# Patient Record
Sex: Female | Born: 1965 | Race: White | Hispanic: No | Smoking: Never smoker
Health system: Southern US, Community
[De-identification: ages and names within clinical notes are randomized; demographics above are authoritative.]

## PROBLEM LIST (undated history)

## (undated) DIAGNOSIS — K635 Polyp of colon: Secondary | ICD-10-CM

## (undated) DIAGNOSIS — N920 Excessive and frequent menstruation with regular cycle: Secondary | ICD-10-CM

## (undated) HISTORY — DX: Excessive and frequent menstruation with regular cycle: N92.0

## (undated) HISTORY — DX: Polyp of colon: K63.5

## (undated) HISTORY — PX: TONSILLECTOMY: SUR1361

---

## 2001-10-27 HISTORY — PX: BLADDER SURGERY: SHX569

## 2003-10-28 HISTORY — PX: ABLATION: SHX5711

## 2018-11-15 NOTE — Patient Instructions (Signed)
I value your feedback and entrusting us with your care. If you get a [facility] patient survey, I would appreciate you taking the time to let us know about your experience today. Thank you! 

## 2018-11-15 NOTE — Progress Notes (Signed)
PCP: Patient, No Pcp Per   Chief Complaint  Patient presents with  . Gynecologic Exam    HPI:      [name] is a 53 y.o. No obstetric history on file. who LMP was No LMP recorded. (Menstrual status: Other)., presents today for her NP annual examination.  Her menses are occas light discoloration for a few days. She still has monthly breast tenderness. Mom and sister were late with menopause. Pt is s/p endometrial ablation due to menorrhagia.  Dysmenorrhea none. She does not have intermenstrual bleeding.  She does not have vasomotor sx.   Sex activity: single partner, contraception - tubal ligation. She does not have vaginal dryness.  Last Pap: not recent; hx of abn with colpo in distant past without tx.  Hx of STDs: none  Last mammogram: not recent There is no FH of breast cancer. There is no FH of ovarian cancer. The patient does not do self-breast exams.  Colonoscopy: never. Pt interested in ref.  FH colon cancer and crohn's.  Tobacco use: The patient denies current or previous tobacco use. Alcohol use: none Exercise: moderately active  She does not get adequate calcium but does get Vitamin D in her diet.  No recent labs.   Past Medical History:  Diagnosis Date  . Menorrhagia     Past Surgical History:  Procedure Laterality Date  . ABLATION  2005  . BLADDER SURGERY  2003  . TONSILLECTOMY     pt was 53 yrs old    Family History  Problem Relation Age of Onset  . Cancer Father        leomyosarcoma  . Colon cancer Maternal Aunt 50  . Crohn's disease Daughter   . Breast cancer Neg Hx   . Ovarian cancer Neg Hx     Social History   Socioeconomic History  . Marital status: Legally Separated    Spouse name: Not on file  . Number of children: Not on file  . Years of education: Not on file  . Highest education level: Not on file  Occupational History  . Not on file  Social Needs  . Financial resource strain: Not on file  . Food insecurity:   Worry: Not on file    Inability: Not on file  . Transportation needs:    Medical: Not on file    Non-medical: Not on file  Tobacco Use  . Smoking status: Never Smoker  . Smokeless tobacco: Never Used  Substance and Sexual Activity  . Alcohol use: Never    Frequency: Never  . Drug use: Never  . Sexual activity: Yes  Lifestyle  . Physical activity:    Days per week: Not on file    Minutes per session: Not on file  . Stress: Not on file  Relationships  . Social connections:    Talks on phone: Not on file    Gets together: Not on file    Attends religious service: Not on file    Active member of club or organization: Not on file    Attends meetings of clubs or organizations: Not on file    Relationship status: Not on file  . Intimate partner violence:    Fear of current or ex partner: Not on file    Emotionally abused: Not on file    Physically abused: Not on file    Forced sexual activity: Not on file  Other Topics Concern  . Not on file  Social History Narrative  .  Not on file    No outpatient medications prior to visit.   No facility-administered medications prior to visit.      ROS:  Review of Systems  Constitutional: Negative for fatigue, fever and unexpected weight change.  Respiratory: Negative for cough, shortness of breath and wheezing.   Cardiovascular: Negative for chest pain, palpitations and leg swelling.  Gastrointestinal: Negative for blood in stool, constipation, diarrhea, nausea and vomiting.  Endocrine: Negative for cold intolerance, heat intolerance and polyuria.  Genitourinary: Negative for dyspareunia, dysuria, flank pain, frequency, genital sores, hematuria, menstrual problem, pelvic pain, urgency, vaginal bleeding, vaginal discharge and vaginal pain.  Musculoskeletal: Negative for back pain, joint swelling and myalgias.  Skin: Negative for rash.  Allergic/Immunologic: Positive for environmental allergies.  Neurological: Negative for dizziness,  syncope, light-headedness, numbness and headaches.  Hematological: Negative for adenopathy.  Psychiatric/Behavioral: Negative for agitation, confusion, sleep disturbance and suicidal ideas. The patient is not nervous/anxious.    BREAST: No symptoms    Objective: BP 116/70   Pulse 68   Ht 5\' 8"  (1.727 m)   Wt 195 lb (88.5 kg)   BMI 29.65 kg/m    Physical Exam Constitutional:      Appearance: She is well-developed.  Genitourinary:     Vulva, vagina, cervix, uterus, right adnexa, left adnexa and rectum normal.     No vulval lesion or tenderness noted.     No vaginal discharge, erythema or tenderness.     No cervical polyp.     Uterus is not enlarged or tender.     No right or left adnexal mass present.     Right adnexa not tender.     Left adnexa not tender.  Rectum:     Guaiac result negative.     No anal fissure.  Neck:     Musculoskeletal: Normal range of motion.     Thyroid: No thyromegaly.  Cardiovascular:     Rate and Rhythm: Normal rate and regular rhythm.     Heart sounds: Normal heart sounds. No murmur.  Pulmonary:     Effort: Pulmonary effort is normal.     Breath sounds: Normal breath sounds.  Chest:     Breasts:        Right: No mass, nipple discharge, skin change or tenderness.        Left: No mass, nipple discharge, skin change or tenderness.  Abdominal:     Palpations: Abdomen is soft.     Tenderness: There is no abdominal tenderness. There is no guarding.  Musculoskeletal: Normal range of motion.  Neurological:     Mental Status: She is alert and oriented to person, place, and time.     Cranial Nerves: No cranial nerve deficit.  Psychiatric:        Behavior: Behavior normal.  Vitals signs reviewed.     Results: Results for orders placed or performed in visit on 11/16/18 (from the past 24 hour(s))  POCT Occult Blood Stool     Status: Normal   Collection Time: 11/16/18  8:53 AM  Result Value Ref Range   Fecal Occult Blood, POC Negative  Negative   Card #1 Date     Card #2 Fecal Occult Blod, POC     Card #2 Date     Card #3 Fecal Occult Blood, POC     Card #3 Date      Assessment/Plan:  Encounter for annual routine gynecological examination  Cervical cancer screening - Plan: Cytology - PAP  Screening for  HPV (human papillomavirus) - Plan: Cytology - PAP  Screening for breast cancer - Pt to sched mammo - Plan: MM 3D SCREEN BREAST BILATERAL  Screening for colon cancer - Neg FOBT. Refer to GI for scr colonoscopy due to age/FH. - Plan: POCT Occult Blood Stool, Ambulatory referral to Gastroenterology  Blood tests for routine general physical examination - Check fasting labs. Will call with results.  - Plan: Comprehensive metabolic panel, Lipid panel, Hemoglobin A1c  Screening cholesterol level - Plan: Lipid panel  Screening for diabetes mellitus - Plan: Hemoglobin A1c  BMI 29.0-29.9,adult - Plan: Hemoglobin A1c          GYN counsel breast self exam, mammography screening, menopause, adequate intake of calcium and vitamin D, diet and exercise    F/U  Return in about 1 year (around 11/17/2019).   B. , PA-C 11/16/2018 8:57 AM

## 2018-11-16 ENCOUNTER — Ambulatory Visit (INDEPENDENT_AMBULATORY_CARE_PROVIDER_SITE_OTHER): Payer: 59 | Admitting: Obstetrics and Gynecology

## 2018-11-16 ENCOUNTER — Encounter: Payer: Self-pay | Admitting: Obstetrics and Gynecology

## 2018-11-16 ENCOUNTER — Other Ambulatory Visit (HOSPITAL_COMMUNITY)
Admission: RE | Admit: 2018-11-16 | Discharge: 2018-11-16 | Disposition: A | Discharge status: Home / Self Care | Payer: 59 | Source: Ambulatory Visit | Attending: Obstetrics and Gynecology | Admitting: Obstetrics and Gynecology

## 2018-11-16 VITALS — BP 116/70 | HR 68 | Ht 68.0 in | Wt 195.0 lb

## 2018-11-16 DIAGNOSIS — Z1151 Encounter for screening for human papillomavirus (HPV): Secondary | ICD-10-CM | POA: Insufficient documentation

## 2018-11-16 DIAGNOSIS — Z13220 Encounter for screening for lipoid disorders: Secondary | ICD-10-CM

## 2018-11-16 DIAGNOSIS — Z01419 Encounter for gynecological examination (general) (routine) without abnormal findings: Secondary | ICD-10-CM | POA: Diagnosis not present

## 2018-11-16 DIAGNOSIS — Z124 Encounter for screening for malignant neoplasm of cervix: Secondary | ICD-10-CM

## 2018-11-16 DIAGNOSIS — Z0000 Encounter for general adult medical examination without abnormal findings: Secondary | ICD-10-CM

## 2018-11-16 DIAGNOSIS — Z1211 Encounter for screening for malignant neoplasm of colon: Secondary | ICD-10-CM | POA: Diagnosis not present

## 2018-11-16 DIAGNOSIS — Z131 Encounter for screening for diabetes mellitus: Secondary | ICD-10-CM

## 2018-11-16 DIAGNOSIS — Z6829 Body mass index (BMI) 29.0-29.9, adult: Secondary | ICD-10-CM

## 2018-11-16 DIAGNOSIS — Z1239 Encounter for other screening for malignant neoplasm of breast: Secondary | ICD-10-CM

## 2018-11-16 LAB — HEMOCCULT GUIAC POC 1CARD (OFFICE): Fecal Occult Blood, POC: NEGATIVE

## 2018-11-19 LAB — CYTOLOGY - PAP
Diagnosis: NEGATIVE
HPV: NOT DETECTED

## 2018-11-22 ENCOUNTER — Encounter: Payer: Self-pay | Admitting: *Deleted

## 2019-01-04 ENCOUNTER — Ambulatory Visit
Admission: RE | Admit: 2019-01-04 | Discharge: 2019-01-04 | Disposition: A | Discharge status: Home / Self Care | Payer: 59 | Source: Ambulatory Visit | Attending: Obstetrics and Gynecology | Admitting: Obstetrics and Gynecology

## 2019-01-04 DIAGNOSIS — Z1231 Encounter for screening mammogram for malignant neoplasm of breast: Secondary | ICD-10-CM | POA: Diagnosis not present

## 2019-01-04 DIAGNOSIS — Z1239 Encounter for other screening for malignant neoplasm of breast: Secondary | ICD-10-CM

## 2019-06-22 ENCOUNTER — Other Ambulatory Visit: Payer: Self-pay

## 2019-06-22 ENCOUNTER — Other Ambulatory Visit: Payer: 59

## 2019-06-22 ENCOUNTER — Encounter: Payer: Self-pay | Admitting: Obstetrics and Gynecology

## 2019-06-22 ENCOUNTER — Ambulatory Visit (INDEPENDENT_AMBULATORY_CARE_PROVIDER_SITE_OTHER): Payer: 59 | Admitting: Obstetrics and Gynecology

## 2019-06-22 VITALS — BP 120/80 | Ht 67.0 in | Wt 193.6 lb

## 2019-06-22 DIAGNOSIS — Z0000 Encounter for general adult medical examination without abnormal findings: Secondary | ICD-10-CM

## 2019-06-22 DIAGNOSIS — Z6829 Body mass index (BMI) 29.0-29.9, adult: Secondary | ICD-10-CM

## 2019-06-22 DIAGNOSIS — F4321 Adjustment disorder with depressed mood: Secondary | ICD-10-CM

## 2019-06-22 DIAGNOSIS — N951 Menopausal and female climacteric states: Secondary | ICD-10-CM

## 2019-06-22 DIAGNOSIS — R5383 Other fatigue: Secondary | ICD-10-CM

## 2019-06-22 DIAGNOSIS — Z13220 Encounter for screening for lipoid disorders: Secondary | ICD-10-CM

## 2019-06-22 DIAGNOSIS — Z131 Encounter for screening for diabetes mellitus: Secondary | ICD-10-CM

## 2019-06-22 NOTE — Progress Notes (Signed)
Patient, No Pcp Per   Chief Complaint  Patient presents with  . Hot Flashes    fatigue x on/off 6 months    HPI:      Jacqueline Torres is a 53 y.o. [id number] who LMP was No LMP recorded. Patient has had an ablation., presents today for increased menopausal sx of hot flashes, night sweats, difficulty staying asleep and fatigue for the past 2-3 wks. Pt was having occas brown d/c for several days for "menses" due to endometrial ablation. Still has monthly breast tenderness. No brown d/c for many months now. Used to have occas vasomotor sx.  Pt under increased stress due to loss of father 5/20 and estate settling. Can fall asleep but often wakes up at 1:00/2:00 and can't get back to sleep now. Taking melatonin 3 mg with sx improvement but feels unrested in morning. Pt exercising. Pt feels like her grieving is appropriate and denies anxiety/depresion sx.   Last annual 1/20, due for fasting labs anyway, so will add on labs today.   Past Medical History:  Diagnosis Date  . Menorrhagia     Past Surgical History:  Procedure Laterality Date  . ABLATION  2005  . BLADDER SURGERY  2003  . TONSILLECTOMY     pt was 53 yrs old    Family History  Problem Relation Age of Onset  . Cancer Father        leomyosarcoma  . Colon cancer Maternal Aunt 86  . Crohn's disease Daughter   . Breast cancer Neg Hx   . Ovarian cancer Neg Hx     Social History   Socioeconomic History  . Marital status: Divorced    Spouse name: Not on file  . Number of children: Not on file  . Years of education: Not on file  . Highest education level: Not on file  Occupational History  . Not on file  Social Needs  . Financial resource strain: Not on file  . Food insecurity    Worry: Not on file    Inability: Not on file  . Transportation needs    Medical: Not on file    Non-medical: Not on file  Tobacco Use  . Smoking status: Never Smoker  . Smokeless tobacco: Never Used  Substance and Sexual Activity   . Alcohol use: Never    Frequency: Never  . Drug use: Never  . Sexual activity: Yes    Birth control/protection: Surgical    Comment: Ablation  Lifestyle  . Physical activity    Days per week: Not on file    Minutes per session: Not on file  . Stress: Not on file  Relationships  . Social Herbalist on phone: Not on file    Gets together: Not on file    Attends religious service: Not on file    Active member of club or organization: Not on file    Attends meetings of clubs or organizations: Not on file    Relationship status: Not on file  . Intimate partner violence    Fear of current or ex partner: Not on file    Emotionally abused: Not on file    Physically abused: Not on file    Forced sexual activity: Not on file  Other Topics Concern  . Not on file  Social History Narrative  . Not on file    Outpatient Medications Prior to Visit  Medication Sig Dispense Refill  . tobramycin-dexamethasone (TOBRADEX) ophthalmic solution  INSTILL 1 DROP INTO BOTH EYES 4 TIMES A DAY     No facility-administered medications prior to visit.       ROS:  Review of Systems  Constitutional: Positive for fatigue. Negative for fever and unexpected weight change.  Respiratory: Negative for cough, shortness of breath and wheezing.   Cardiovascular: Negative for chest pain, palpitations and leg swelling.  Gastrointestinal: Positive for constipation. Negative for blood in stool, diarrhea, nausea and vomiting.  Endocrine: Negative for cold intolerance, heat intolerance and polyuria.  Genitourinary: Positive for dyspareunia. Negative for dysuria, flank pain, frequency, genital sores, hematuria, menstrual problem, pelvic pain, urgency, vaginal bleeding, vaginal discharge and vaginal pain.  Musculoskeletal: Positive for arthralgias. Negative for back pain, joint swelling and myalgias.  Skin: Negative for rash.  Neurological: Negative for dizziness, syncope, light-headedness, numbness and  headaches.  Hematological: Negative for adenopathy.  Psychiatric/Behavioral: Negative for agitation, confusion, sleep disturbance and suicidal ideas. The patient is not nervous/anxious.    BREAST: tenderness   OBJECTIVE:   Vitals:  BP 120/80   Ht 5\' 7"  (1.702 m)   Wt 193 lb 9.6 oz (87.8 kg)   BMI 30.32 kg/m   Physical Exam Vitals signs reviewed.  Constitutional:      Appearance: She is well-developed.  Neck:     Musculoskeletal: Normal range of motion.  Pulmonary:     Effort: Pulmonary effort is normal.  Musculoskeletal: Normal range of motion.  Skin:    General: Skin is warm and dry.  Neurological:     General: No focal deficit present.     Mental Status: She is alert and oriented to person, place, and time.     Cranial Nerves: No cranial nerve deficit.  Psychiatric:        Mood and Affect: Mood normal.        Behavior: Behavior normal.        Thought Content: Thought content normal.        Judgment: Judgment normal.     Assessment/Plan: Vasomotor symptoms due to menopause - Plan: Estradiol, Follicle stimulating hormone, TSH + free T4; Check labs. If neg, most likely due to increased stress. Increase exercise/rest/stress mgmt. F/u prn.   Fatigue, unspecified type - Plan: TSH + free T4; see if sx improve with increased sleep time due to melatonin.  Grief reaction--appropriate per pt. F/u prn.      Return if symptoms worsen or fail to improve.   B. , PA-C 06/22/2019 11:40 AM

## 2019-06-22 NOTE — Patient Instructions (Signed)
I value your feedback and entrusting us with your care. If you get a [facility] patient survey, I would appreciate you taking the time to let us know about your experience today. Thank you! 

## 2019-06-23 LAB — COMPREHENSIVE METABOLIC PANEL
ALT: 24 IU/L (ref 0–32)
AST: 20 IU/L (ref 0–40)
Albumin/Globulin Ratio: 2.1 (ref 1.2–2.2)
Albumin: 4.7 g/dL (ref 3.8–4.9)
Alkaline Phosphatase: 62 IU/L (ref 39–117)
BUN/Creatinine Ratio: 15 (ref 9–23)
BUN: 14 mg/dL (ref 6–24)
Bilirubin Total: 0.4 mg/dL (ref 0.0–1.2)
CO2: 26 mmol/L (ref 20–29)
Calcium: 9.6 mg/dL (ref 8.7–10.2)
Chloride: 101 mmol/L (ref 96–106)
Creatinine, Ser: 0.93 mg/dL (ref 0.57–1.00)
GFR calc Af Amer: 81 mL/min/{1.73_m2} (ref >59)
GFR calc non Af Amer: 70 mL/min/{1.73_m2} (ref >59)
Globulin, Total: 2.2 g/dL (ref 1.5–4.5)
Glucose: 88 mg/dL (ref 65–99)
Potassium: 4.2 mmol/L (ref 3.5–5.2)
Sodium: 142 mmol/L (ref 134–144)
Total Protein: 6.9 g/dL (ref 6.0–8.5)

## 2019-06-23 LAB — LIPID PANEL
Chol/HDL Ratio: 2.6 ratio (ref 0.0–4.4)
Cholesterol, Total: 191 mg/dL (ref 100–199)
HDL: 74 mg/dL (ref >39)
LDL Calculated: 96 mg/dL (ref 0–99)
Triglycerides: 106 mg/dL (ref 0–149)
VLDL Cholesterol Cal: 21 mg/dL (ref 5–40)

## 2019-06-23 LAB — TSH+FREE T4
Free T4: 1.01 ng/dL (ref 0.82–1.77)
TSH: 2.16 u[IU]/mL (ref 0.450–4.500)

## 2019-06-23 LAB — HEMOGLOBIN A1C
Est. average glucose Bld gHb Est-mCnc: 111 mg/dL
Hgb A1c MFr Bld: 5.5 % (ref 4.8–5.6)

## 2019-06-23 LAB — ESTRADIOL: Estradiol: 30 pg/mL

## 2019-06-23 LAB — FOLLICLE STIMULATING HORMONE: FSH: 53.3 m[IU]/mL

## 2019-09-15 ENCOUNTER — Telehealth: Payer: Self-pay | Admitting: Obstetrics and Gynecology

## 2019-09-15 NOTE — Telephone Encounter (Signed)
Pt needs new referral for colonoscopy please

## 2019-09-15 NOTE — Telephone Encounter (Signed)
Called pt to let her know msg will be forwarded to [facility]. Advised she is out of the office for vacation but will return her phone call as soon as I hear back from [facility]. No answer, left detailed msg.

## 2019-11-06 ENCOUNTER — Telehealth: Payer: PRIVATE HEALTH INSURANCE | Admitting: Family

## 2019-11-06 DIAGNOSIS — Z20822 Contact with and (suspected) exposure to covid-19: Secondary | ICD-10-CM

## 2019-11-06 MED ORDER — BENZONATATE 100 MG PO CAPS: 100.0000 mg | ORAL_CAPSULE | Freq: Three times a day (TID) | ORAL | 0 refills | Status: DC | PRN

## 2019-11-06 NOTE — Progress Notes (Signed)
E-Visit for Corona Virus Screening   Your current symptoms could be consistent with the coronavirus.  Many health care providers can now test patients at their office but not all are.  [facility] has multiple testing sites. For information on our COVID testing locations and hours go to [email or web address]  We are enrolling you in our MyChart Home Monitoring for COVID19 . Daily you will receive a questionnaire within the MyChart website. Our COVID 19 response team will be monitoring your responses daily.  Testing Information: The COVID-19 Community Testing sites will begin testing BY APPOINTMENT ONLY.  You can schedule online at [email or web address]  If you do not have access to a smart phone or computer you may call [phone] for an appointment.  Testing Locations: Appointment schedule is 8 am to 3:30 pm at all sites  [facility] indoors at [address], [place] [address] [facility]  indoors at [address] Vis[address]trance, [place], [place] [address] [place] indoors at [address], [place] [address]  Additional testing sites in the Community:  . For [facility] Testing sites in [place]  https://www.cvs.com/minuteclinic/covid-19-testing  . For Pop-up testing sites in [place]  https://covid19.ncdhhs.gov/about-covid-19/testing/find-my-testing-place/pop-testing-sites  . For Testing sites with regular hours https://onsms.org/[place]/  . For [place] [facility] https://[facility].com/covid-19-community-outreach-testing/  . For [facility] https://www.[place].gov[place]/human-services/health-department/coronavirus-covid-19-info/covid-19-testing  . For [place] testing in [place] https://www.[place].gov/[place]/human-services/health-department/coronavirus-covid-19-info/covid-19-testing  . For [facility] testing in [place]   https://lhi.care/covidtesting  For  more  information about community testing call [phone]   We are enrolling you in our MyChart Home Monitoring for COVID19 . Daily you will receive a questionnaire within the MyChart website. Our COVID 19 response team will be monitoring your responses daily.  Please quarantine yourself while awaiting your test results. If you develop fever/cough/breathlessness, please stay home for 10 days with improving symptoms and until you have had 24 hours of no fever (without taking a fever reducer).  You should wear a mask or cloth face covering over your nose and mouth if you must be around other people or animals, including pets (even at home). Try to stay at least 6 feet away from other people. This will protect the people around you.  Please continue good preventive care measures, including:  frequent hand-washing, avoid touching your face, cover coughs/sneezes, stay out of crowds and keep a 6 foot distance from others.  COVID-19 is a respiratory illness with symptoms that are similar to the flu. Symptoms are typically mild to moderate, but there have been cases of severe illness and death due to the virus.   The following symptoms may appear 2-14 days after exposure: . Fever . Cough . Shortness of breath or difficulty breathing . Chills . Repeated shaking with chills . Muscle pain . Headache . Sore throat . New loss of taste or smell . Fatigue . Congestion or runny nose . Nausea or vomiting . Diarrhea  Go to the nearest hospital ED for assessment if fever/cough/breathlessness are severe or illness seems like a threat to life.  It is vitally important that if you feel that you have an infection such as this virus or any other virus that you stay home and away from places where you may spread it to others.  You should avoid contact with people age 65 and older.   You can use medication such as A prescription cough medication called Tessalon Perles 100 mg. You may take 1-2 capsules every 8 hours as    needed for cough.  You may also take acetaminophen (Tylenol) as needed for fever.  Reduce your risk of any infection by using the same precautions used for avoiding the common cold or flu:  . Wash your hands often with soap and warm water for at least 20 seconds.  If soap and water are not readily available, use an alcohol-based hand sanitizer with at least 60% alcohol.  . If coughing or sneezing, cover your mouth and nose by coughing or sneezing into the elbow areas of your shirt or coat, into a tissue or into your sleeve (not your hands). . Avoid shaking hands with others and consider head nods or verbal greetings only. . Avoid touching your eyes, nose, or mouth with unwashed hands.  . Avoid close contact with people who are sick. . Avoid places or events with large numbers of people in one location, like concerts or sporting events. . Carefully consider travel plans you have or are making. . If you are planning any travel outside or inside the [place], visit the CDC's Travelers' Health webpage for the latest health notices. . If you have some symptoms but not all symptoms, continue to monitor at home and seek medical attention if your symptoms worsen. . If you are having a medical emergency, call 911.  HOME CARE . Only take medications as instructed by your medical team. . Drink plenty of fluids and get plenty of rest. . A steam or ultrasonic humidifier can help if you have congestion.   GET HELP RIGHT AWAY IF YOU HAVE EMERGENCY WARNING SIGNS** FOR COVID-19. If you or someone is showing any of these signs seek emergency medical care immediately. Call 911 or proceed to your closest emergency facility if: . You develop worsening high fever. . Trouble breathing . Bluish lips or face . Persistent pain or pressure in the chest . New confusion . Inability to wake or stay awake . You cough up blood. . Your symptoms become more severe  **This list is not all possible symptoms. Contact your  medical provider for any symptoms that are sever or concerning to you.  MAKE SURE YOU   Understand these instructions.  Will watch your condition.  Will get help right away if you are not doing well or get worse.  Your e-visit answers were reviewed by a board certified advanced clinical practitioner to complete your personal care plan.  Depending on the condition, your plan could have included both over the counter or prescription medications.  If there is a problem please reply once you have received a response from your provider.  Your safety is important to us.  If you have drug allergies check your prescription carefully.    You can use MyChart to ask questions about today's visit, request a non-urgent call back, or ask for a work or school excuse for 24 hours related to this e-Visit. If it has been greater than 24 hours you will need to follow up with your provider, or enter a new e-Visit to address those concerns. You will get an e-mail in the next two days asking about your experience.  I hope that your e-visit has been valuable and will speed your recovery. Thank you for using e-visits.  Approximately 5 minutes was spent documenting and reviewing patient's chart.    

## 2020-01-13 ENCOUNTER — Ambulatory Visit: Payer: 59 | Attending: Internal Medicine

## 2020-01-13 DIAGNOSIS — Z23 Encounter for immunization: Secondary | ICD-10-CM

## 2020-01-13 NOTE — Progress Notes (Signed)
   Covid-19 Vaccination Clinic  Name:  Jacqueline Torres    MRN: [id number] DOB: April 21, 1966  01/13/2020  Jacqueline Torres was observed post Covid-19 immunization for 15 minutes without incident. She was provided with Vaccine Information Sheet and instruction to access the V-Safe system.   Jacqueline Torres was instructed to call 911 with any severe reactions post vaccine: Marland Kitchen Difficulty breathing  . Swelling of face and throat  . A fast heartbeat  . A bad rash all over body  . Dizziness and weakness   Immunizations Administered    Name Date Dose VIS Date Route   Pfizer COVID-19 Vaccine 01/13/2020  8:33 AM 0.3 mL 10/07/2019 Intramuscular   Manufacturer: [employer], [employer]   Lot: [id number]   NDC: [address]-6

## 2020-02-06 ENCOUNTER — Ambulatory Visit (INDEPENDENT_AMBULATORY_CARE_PROVIDER_SITE_OTHER): Payer: 59 | Admitting: Obstetrics and Gynecology

## 2020-02-06 ENCOUNTER — Other Ambulatory Visit: Payer: Self-pay

## 2020-02-06 ENCOUNTER — Encounter: Payer: Self-pay | Admitting: Obstetrics and Gynecology

## 2020-02-06 VITALS — BP 110/90 | Ht 68.0 in | Wt 197.0 lb

## 2020-02-06 DIAGNOSIS — Z1211 Encounter for screening for malignant neoplasm of colon: Secondary | ICD-10-CM | POA: Diagnosis not present

## 2020-02-06 DIAGNOSIS — R310 Gross hematuria: Secondary | ICD-10-CM | POA: Diagnosis not present

## 2020-02-06 LAB — POCT URINALYSIS DIPSTICK
Bilirubin, UA: NEGATIVE
Blood, UA: NEGATIVE
Glucose, UA: NEGATIVE
Ketones, UA: NEGATIVE
Leukocytes, UA: NEGATIVE
Nitrite, UA: NEGATIVE
Protein, UA: NEGATIVE
Spec Grav, UA: 1.02 (ref 1.010–1.025)
pH, UA: 5 (ref 5.0–8.0)

## 2020-02-06 NOTE — Progress Notes (Signed)
Patient, No Pcp Per   Chief Complaint  Patient presents with  . Urinary Tract Infection    blood in urine, no urinary frequency or burning     HPI:      [name] is a 54 y.o. [id number] who LMP was No LMP recorded. Patient has had an ablation., presents today for hematuria that lasts 1 day. Sx have happened twice, both a day or 2 after sex. No bleeding during/after sex, no unusual pain with sex, no vag dryness. First had sx 1/21 and treated with abx for UTI. Sx then recurred last wk. Pt almost sure it's in urine and not vaginal. No blood in underwear; only notes blood with wiping after urination. Hx of bladder sling. No hx of kidney stones. Having increased urge incont, drinks 2 caffeinated drinks daily.   Menses are infrequent; hx of endometrial ablation.   Past Medical History:  Diagnosis Date  . Menorrhagia     Past Surgical History:  Procedure Laterality Date  . ABLATION  2005  . BLADDER SURGERY  2003  . TONSILLECTOMY     pt was 54 yrs old    Family History  Problem Relation Age of Onset  . Cancer Father        leomyosarcoma  . Colon cancer Maternal Aunt 50  . Crohn's disease Daughter   . Breast cancer Neg Hx   . Ovarian cancer Neg Hx     Social History   Socioeconomic History  . Marital status: Divorced    Spouse name: Not on file  . Number of children: Not on file  . Years of education: Not on file  . Highest education level: Not on file  Occupational History  . Not on file  Tobacco Use  . Smoking status: Never Smoker  . Smokeless tobacco: Never Used  Substance and Sexual Activity  . Alcohol use: Never  . Drug use: Never  . Sexual activity: Yes    Birth control/protection: Surgical    Comment: Ablation  Other Topics Concern  . Not on file  Social History Narrative  . Not on file   Social Determinants of Health   Financial Resource Strain:   . Difficulty of Paying Living Expenses:   Food Insecurity:   . Worried About Brewing technologist in the Last Year:   . Barista in the Last Year:   Transportation Needs:   . Freight forwarder (Medical):   Marland Kitchen Lack of Transportation (Non-Medical):   Physical Activity:   . Days of Exercise per Week:   . Minutes of Exercise per Session:   Stress:   . Feeling of Stress :   Social Connections:   . Frequency of Communication with Friends and Family:   . Frequency of Social Gatherings with Friends and Family:   . Attends Religious Services:   . Active Member of Clubs or Organizations:   . Attends Banker Meetings:   Marland Kitchen Marital Status:   Intimate Partner Violence:   . Fear of Current or Ex-Partner:   . Emotionally Abused:   Marland Kitchen Physically Abused:   . Sexually Abused:     Outpatient Medications Prior to Visit  Medication Sig Dispense Refill  . benzonatate (TESSALON PERLES) 100 MG capsule Take 1 capsule (100 mg total) by mouth 3 (three) times daily as needed. 20 capsule 0  . tobramycin-dexamethasone (TOBRADEX) ophthalmic solution INSTILL 1 DROP INTO BOTH EYES 4 TIMES A DAY  No facility-administered medications prior to visit.      ROS:  Review of Systems  Constitutional: Negative for fever.  Gastrointestinal: Negative for blood in stool, constipation, diarrhea, nausea and vomiting.  Genitourinary: Positive for hematuria and urgency. Negative for dyspareunia, dysuria, flank pain, frequency, vaginal bleeding, vaginal discharge and vaginal pain.  Musculoskeletal: Negative for back pain.  Skin: Negative for rash.  BREAST: No symptoms   OBJECTIVE:   Vitals:  BP 110/90   Ht 5\' 8"  (1.727 m)   Wt 197 lb (89.4 kg)   BMI 29.95 kg/m   Physical Exam Vitals reviewed.  Constitutional:      Appearance: She is well-developed.  Pulmonary:     Effort: Pulmonary effort is normal.  Genitourinary:    General: Normal vulva.     Pubic Area: No rash.      Labia:        Right: No rash, tenderness or lesion.        Left: No rash, tenderness or lesion.       Vagina: Normal. No vaginal discharge, erythema or tenderness.     Cervix: Normal.     Uterus: Normal. Not enlarged and not tender.      Adnexa: Right adnexa normal and left adnexa normal.       Right: No mass or tenderness.         Left: No mass or tenderness.    Musculoskeletal:        General: Normal range of motion.     Cervical back: Normal range of motion.  Skin:    General: Skin is warm and dry.  Neurological:     General: No focal deficit present.     Mental Status: She is alert and oriented to person, place, and time.  Psychiatric:        Mood and Affect: Mood normal.        Behavior: Behavior normal.        Thought Content: Thought content normal.        Judgment: Judgment normal.     Results: Results for orders placed or performed in visit on 02/06/20 (from the past 24 hour(s))  POCT Urinalysis Dipstick     Status: Normal   Collection Time: 02/06/20  5:03 PM  Result Value Ref Range   Color, UA yellow    Clarity, UA clear    Glucose, UA Negative Negative   Bilirubin, UA neg    Ketones, UA neg    Spec Grav, UA 1.020 1.010 - 1.025   Blood, UA neg    pH, UA 5.0 5.0 - 8.0   Protein, UA Negative Negative   Urobilinogen, UA     Nitrite, UA neg    Leukocytes, UA Negative Negative   Appearance     Odor       Assessment/Plan: Gross hematuria - Plan: POCT Urinalysis Dipstick, Urine Culture; Neg UA, neg vaginal exam. Check C&S. Will f/u with results if pos. Otherwise, if sx happen again, pt to insert tampon to make sure bleeding is truly from bladder vs vaginally and f/u. If from bladder, will refer to urology.   Screening for colon cancer - Plan: Ambulatory referral to Gastroenterology; Pt never had colonoscopy last yr due to covid and needs new ref.      Return in about 4 weeks (around 03/05/2020), or if symptoms worsen or fail to improve, for annual.  Jacqueline Torres B. Jullian Clayson, PA-C 02/06/2020 5:05 PM

## 2020-02-06 NOTE — Patient Instructions (Signed)
I value your feedback and entrusting us with your care. If you get a [facility] patient survey, I would appreciate you taking the time to let us know about your experience today. Thank you!  As of October 06, 2019, your lab results will be released to your MyChart immediately, before I even have a chance to see them. Please give me time to review them and contact you if there are any abnormalities. Thank you for your patience.  

## 2020-02-08 ENCOUNTER — Telehealth: Payer: Self-pay

## 2020-02-08 ENCOUNTER — Ambulatory Visit: Payer: 59 | Attending: Internal Medicine

## 2020-02-08 DIAGNOSIS — Z23 Encounter for immunization: Secondary | ICD-10-CM

## 2020-02-08 LAB — URINE CULTURE

## 2020-02-08 NOTE — Progress Notes (Signed)
   Covid-19 Vaccination Clinic  Name:  Jacqueline Torres    MRN: [id number] DOB: 1966-05-14  02/08/2020  [name] was observed post Covid-19 immunization for 15 minutes without incident. She was provided with Vaccine Information Sheet and instruction to access the V-Safe system.   [name] was instructed to call 911 with any severe reactions post vaccine: Marland Kitchen Difficulty breathing  . Swelling of face and throat  . A fast heartbeat  . A bad rash all over body  . Dizziness and weakness   Immunizations Administered    Name Date Dose VIS Date Route   Pfizer COVID-19 Vaccine 02/08/2020  4:33 PM 0.3 mL 10/07/2019 Intramuscular   Manufacturer: [employer], [employer]   Lot: [id number]   NDC: [address]-3

## 2020-02-08 NOTE — Telephone Encounter (Signed)
Patient would like to first know if it's possible to have her colon procedure on 5/17? If so, she's ready to proceed. Please let me know and I'll call her an make a nurse visit

## 2020-02-09 NOTE — Telephone Encounter (Signed)
Nurse visit Wed. 02/15/20. Prefers Dr. Tobi Bastos for procedure. Referral status closed. Pls re-open

## 2020-02-15 ENCOUNTER — Telehealth (INDEPENDENT_AMBULATORY_CARE_PROVIDER_SITE_OTHER): Payer: Self-pay | Admitting: Gastroenterology

## 2020-02-15 ENCOUNTER — Other Ambulatory Visit: Payer: Self-pay

## 2020-02-15 DIAGNOSIS — Z1211 Encounter for screening for malignant neoplasm of colon: Secondary | ICD-10-CM

## 2020-02-15 NOTE — Progress Notes (Signed)
Gastroenterology Pre-Procedure Review  Request Date: Monday May 17th Requesting Physician: Dr. Tobi Bastos  PATIENT REVIEW QUESTIONS: The patient responded to the following health history questions as indicated:    1. Are you having any GI issues? no 2. Do you have a personal history of Polyps? no 3. Do you have a family history of Colon Cancer or Polyps? no 4. Diabetes Mellitus? no 5. Joint replacements in the past 12 months?no 6. Major health problems in the past 3 months?no 7. Any artificial heart valves, MVP, or defibrillator?no    MEDICATIONS & ALLERGIES:    Patient reports the following regarding taking any anticoagulation/antiplatelet therapy:   Plavix, Coumadin, Eliquis, Xarelto, Lovenox, Pradaxa, Brilinta, or Effient? no Aspirin? no  Patient confirms/reports the following medications:  No current outpatient medications on file.   No current facility-administered medications for this visit.    Patient confirms/reports the following allergies:  Allergies  Allergen Reactions  . Sulfa Antibiotics     Rash, pt feels like she is hungover.    No orders of the defined types were placed in this encounter.   AUTHORIZATION INFORMATION Primary Insurance: 1D#: Group #:  Secondary Insurance: 1D#: Group #:  SCHEDULE INFORMATION: Date: Monday May 17th Time: Location:[facility]

## 2020-03-08 ENCOUNTER — Other Ambulatory Visit
Admission: RE | Admit: 2020-03-08 | Discharge: 2020-03-08 | Disposition: A | Discharge status: Home / Self Care | Payer: 59 | Source: Ambulatory Visit | Attending: Gastroenterology | Admitting: Gastroenterology

## 2020-03-08 ENCOUNTER — Other Ambulatory Visit: Payer: Self-pay

## 2020-03-08 DIAGNOSIS — Z01812 Encounter for preprocedural laboratory examination: Secondary | ICD-10-CM | POA: Insufficient documentation

## 2020-03-08 DIAGNOSIS — Z20822 Contact with and (suspected) exposure to covid-19: Secondary | ICD-10-CM | POA: Diagnosis not present

## 2020-03-08 LAB — SARS CORONAVIRUS 2 (TAT 6-24 HRS): SARS Coronavirus 2: NEGATIVE

## 2020-03-09 ENCOUNTER — Encounter: Payer: Self-pay | Admitting: Gastroenterology

## 2020-03-12 ENCOUNTER — Ambulatory Visit: Payer: 59 | Admitting: Anesthesiology

## 2020-03-12 ENCOUNTER — Encounter
Admission: RE | Disposition: A | Discharge status: Home / Self Care | Payer: Self-pay | Source: Home / Self Care | Attending: Gastroenterology

## 2020-03-12 ENCOUNTER — Ambulatory Visit
Admission: RE | Admit: 2020-03-12 | Discharge: 2020-03-12 | Disposition: A | Discharge status: Home / Self Care | Payer: 59 | Attending: Gastroenterology | Admitting: Gastroenterology

## 2020-03-12 ENCOUNTER — Other Ambulatory Visit: Payer: Self-pay

## 2020-03-12 DIAGNOSIS — Z808 Family history of malignant neoplasm of other organs or systems: Secondary | ICD-10-CM | POA: Insufficient documentation

## 2020-03-12 DIAGNOSIS — Z800 Family history of malignant neoplasm of digestive organs: Secondary | ICD-10-CM | POA: Diagnosis not present

## 2020-03-12 DIAGNOSIS — Z882 Allergy status to sulfonamides status: Secondary | ICD-10-CM | POA: Insufficient documentation

## 2020-03-12 DIAGNOSIS — Z1211 Encounter for screening for malignant neoplasm of colon: Secondary | ICD-10-CM | POA: Diagnosis not present

## 2020-03-12 DIAGNOSIS — K5730 Diverticulosis of large intestine without perforation or abscess without bleeding: Secondary | ICD-10-CM | POA: Insufficient documentation

## 2020-03-12 DIAGNOSIS — K635 Polyp of colon: Secondary | ICD-10-CM | POA: Diagnosis not present

## 2020-03-12 HISTORY — PX: COLONOSCOPY WITH PROPOFOL: SHX5780

## 2020-03-12 SURGERY — COLONOSCOPY WITH PROPOFOL
Anesthesia: General

## 2020-03-12 MED ORDER — SODIUM CHLORIDE 0.9 % IV SOLN: INTRAVENOUS | Status: DC

## 2020-03-12 MED ORDER — ONDANSETRON HCL 4 MG/2ML IJ SOLN
INTRAMUSCULAR | Status: AC
  Filled 2020-03-12: qty 2

## 2020-03-12 MED ORDER — PROPOFOL 10 MG/ML IV BOLUS
INTRAVENOUS | Status: DC | PRN
  Administered 2020-03-12: 20 mg via INTRAVENOUS
  Administered 2020-03-12: 70 mg via INTRAVENOUS

## 2020-03-12 MED ORDER — MIDAZOLAM HCL 2 MG/2ML IJ SOLN
INTRAMUSCULAR | Status: DC | PRN
  Administered 2020-03-12: 2 mg via INTRAVENOUS

## 2020-03-12 MED ORDER — PROPOFOL 500 MG/50ML IV EMUL
INTRAVENOUS | Status: AC
  Filled 2020-03-12: qty 50

## 2020-03-12 MED ORDER — ONDANSETRON HCL 4 MG/2ML IJ SOLN
INTRAMUSCULAR | Status: DC | PRN
  Administered 2020-03-12: 4 mg via INTRAVENOUS

## 2020-03-12 MED ORDER — LIDOCAINE HCL (CARDIAC) PF 100 MG/5ML IV SOSY
PREFILLED_SYRINGE | INTRAVENOUS | Status: DC | PRN
  Administered 2020-03-12: 40 mg via INTRAVENOUS

## 2020-03-12 MED ORDER — PROPOFOL 500 MG/50ML IV EMUL
INTRAVENOUS | Status: DC | PRN
  Administered 2020-03-12: 150 ug/kg/min via INTRAVENOUS

## 2020-03-12 MED ORDER — MIDAZOLAM HCL 2 MG/2ML IJ SOLN
INTRAMUSCULAR | Status: AC
  Filled 2020-03-12: qty 2

## 2020-03-12 MED ORDER — LIDOCAINE HCL (PF) 2 % IJ SOLN
INTRAMUSCULAR | Status: AC
  Filled 2020-03-12: qty 5

## 2020-03-12 NOTE — Transfer of Care (Signed)
Immediate Anesthesia Transfer of Care Note  Patient: Jacqueline Torres  Procedure(s) Performed: Procedure(s): COLONOSCOPY WITH PROPOFOL (N/A)  Patient Location: PACU and Endoscopy Unit  Anesthesia Type:General  Level of Consciousness: sedated  Airway & Oxygen Therapy: Patient Spontanous Breathing and Patient connected to nasal cannula oxygen  Post-op Assessment: Report given to RN and Post -op Vital signs reviewed and stable  Post vital signs: Reviewed and stable  Last Vitals:  Vitals:   03/12/20 0742 03/12/20 0848  BP: 135/90 (!) 146/94  Pulse: 83 85  Resp: 18 11  Temp: (!) 36.1 C (!) 36.1 C  SpO2: 98% 97%    Complications: No apparent anesthesia complications

## 2020-03-12 NOTE — H&P (Signed)
Jacqueline Bellows, MD [address], [place], [place], [place], [address] [address], [place], [place], [place], [address] Phone: [phone]  Fax: [phone]  Primary Care Physician:  [facility], [place]   Pre-Procedure History & Physical: HPI:  Jacqueline Torres is a 54 y.o. female is here for an colonoscopy.   Past Medical History:  Diagnosis Date  . Menorrhagia     Past Surgical History:  Procedure Laterality Date  . ABLATION  2005  . BLADDER SURGERY  2003  . TONSILLECTOMY     pt was 54 yrs old    Prior to Admission medications   Not on File    Allergies as of 02/15/2020 - Review Complete 02/15/2020  Allergen Reaction Noted  . Sulfa antibiotics  11/16/2018    Family History  Problem Relation Age of Onset  . Cancer Father        leomyosarcoma  . Colon cancer Maternal Aunt 62  . Crohn's disease Daughter   . Breast cancer Neg Hx   . Ovarian cancer Neg Hx     Social History   Socioeconomic History  . Marital status: Divorced    Spouse name: Not on file  . Number of children: Not on file  . Years of education: Not on file  . Highest education level: Not on file  Occupational History  . Not on file  Tobacco Use  . Smoking status: Never Smoker  . Smokeless tobacco: Never Used  Substance and Sexual Activity  . Alcohol use: Never  . Drug use: Never  . Sexual activity: Yes    Birth control/protection: Surgical    Comment: Ablation  Other Topics Concern  . Not on file  Social History Narrative  . Not on file   Social Determinants of Health   Financial Resource Strain:   . Difficulty of Paying Living Expenses:   Food Insecurity:   . Worried About Charity fundraiser in the Last Year:   . Arboriculturist in the Last Year:   Transportation Needs:   . Film/video editor (Medical):   Marland Kitchen Lack of Transportation (Non-Medical):   Physical Activity:   . Days of Exercise per Week:   . Minutes of Exercise per Session:   Stress:   . Feeling  of Stress :   Social Connections:   . Frequency of Communication with Friends and Family:   . Frequency of Social Gatherings with Friends and Family:   . Attends Religious Services:   . Active Member of Clubs or Organizations:   . Attends Archivist Meetings:   Marland Kitchen Marital Status:   Intimate Partner Violence:   . Fear of Current or Ex-Partner:   . Emotionally Abused:   Marland Kitchen Physically Abused:   . Sexually Abused:     Review of Systems: See HPI, otherwise negative ROS  Physical Exam: BP 135/90   Pulse 83   Temp (!) 97 F (36.1 C) (Temporal)   Resp 18   Ht 5\' 8"  (1.727 m)   Wt 85.7 kg   SpO2 98%   BMI 28.74 kg/m  General:   Alert,  pleasant and cooperative in NAD Head:  Normocephalic and atraumatic. Neck:  Supple; no masses or thyromegaly. Lungs:  Clear throughout to auscultation, normal respiratory effort.    Heart:  +S1, +S2, Regular rate and rhythm, No edema. Abdomen:  Soft, nontender and nondistended. Normal bowel sounds, without guarding, and without rebound.   Neurologic:  Alert and  oriented x4;  grossly normal neurologically.  Impression/Plan: Jacqueline Torres is here for an colonoscopy to be performed for Screening colonoscopy average risk   Risks, benefits, limitations, and alternatives regarding  colonoscopy have been reviewed with the patient.  Questions have been answered.  All parties agreeable.   Wyline Mood, MD  03/12/2020, 8:18 AM

## 2020-03-12 NOTE — Op Note (Signed)
[facility] Gastroenterology Patient Name: Jacqueline Torres Procedure Date: 03/12/2020 8:11 AM MRN: [id number] Account #: [id number] Date of Birth: Dec 30, 1965 Admit Type: Outpatient Age: 54 Room: [facility] ENDO ROOM 4 Gender: Female Note Status: Finalized Procedure:             Colonoscopy Indications:           Screening for colorectal malignant neoplasm Providers:             Wyline Mood MD, MD Referring MD:          Althea Grimmer PA Medicines:             Monitored Anesthesia Care Complications:         No immediate complications. Procedure:             Pre-Anesthesia Assessment:                        - Prior to the procedure, a History and Physical was                         performed, and patient medications, allergies and                         sensitivities were reviewed. The patient's tolerance                         of previous anesthesia was reviewed.                        - The risks and benefits of the procedure and the                         sedation options and risks were discussed with the                         patient. All questions were answered and informed                         consent was obtained.                        - ASA Grade Assessment: II - A patient with mild                         systemic disease.                        After obtaining informed consent, the colonoscope was                         passed under direct vision. Throughout the procedure,                         the patient's blood pressure, pulse, and oxygen                         saturations were monitored continuously. The                         Colonoscope was introduced through the anus and  advanced to the the cecum, identified by the                         appendiceal orifice. The colonoscopy was performed                         with ease. The patient tolerated the procedure well.                         The quality of the bowel  preparation was excellent. Findings:      The perianal and digital rectal examinations were normal.      A 7 mm polyp was found in the ascending colon. The polyp was sessile.       The polyp was removed with a cold snare. Resection and retrieval were       complete.      Multiple small-mouthed diverticula were found in the left colon.      The exam was otherwise without abnormality on direct and retroflexion       views. Impression:            - One 7 mm polyp in the ascending colon, removed with                         a cold snare. Resected and retrieved.                        - Diverticulosis in the left colon.                        - The examination was otherwise normal on direct and                         retroflexion views. Recommendation:        - Discharge patient to home (with escort).                        - Resume previous diet.                        - Continue present medications.                        - Await pathology results.                        - Repeat colonoscopy for surveillance based on                         pathology results. Procedure Code(s):     --- Professional ---                        [id number], Colonoscopy, flexible; with removal of                         tumor(s), polyp(s), or other lesion(s) by snare                         technique Diagnosis Code(s):     --- Professional ---  Z12.11, Encounter for screening for malignant neoplasm                         of colon                        K63.5, Polyp of colon                        K57.30, Diverticulosis of large intestine without                         perforation or abscess without bleeding CPT copyright 2019 American Medical Association. All rights reserved. The codes documented in this report are preliminary and upon coder review may  be revised to meet current compliance requirements. Wyline Mood, MD Wyline Mood MD, MD 03/12/2020 8:46:21 AM This report has been signed  electronically. Number of Addenda: 0 Note Initiated On: 03/12/2020 8:11 AM Scope Withdrawal Time: 0 hours 13 minutes 21 seconds  Total Procedure Duration: 0 hours 20 minutes 25 seconds  Estimated Blood Loss:  Estimated blood loss: none.      [facility]

## 2020-03-12 NOTE — Anesthesia Preprocedure Evaluation (Signed)
Anesthesia Evaluation  Patient identified by MRN, date of birth, ID band Patient awake    Reviewed: Allergy & Precautions, NPO status , Patient's Chart, lab work & pertinent test results  History of Anesthesia Complications (+) PONVNegative for: history of anesthetic complications  Airway Mallampati: II  TM Distance: >3 FB Neck ROM: Full    Dental no notable dental hx. (+) Teeth Intact   Pulmonary neg pulmonary ROS, neg sleep apnea, neg COPD, Patient abstained from smoking.Not current smoker,    Pulmonary exam normal breath sounds clear to auscultation       Cardiovascular Exercise Tolerance: Good METS(-) hypertension(-) CAD and (-) Past MI negative cardio ROS  (-) dysrhythmias  Rhythm:Regular Rate:Normal - Systolic murmurs    Neuro/Psych negative neurological ROS  negative psych ROS   GI/Hepatic neg GERD  ,(+)     (-) substance abuse  ,   Endo/Other  neg diabetes  Renal/GU negative Renal ROS     Musculoskeletal   Abdominal   Peds  Hematology   Anesthesia Other Findings Past Medical History: No date: Menorrhagia  Reproductive/Obstetrics                             Anesthesia Physical Anesthesia Plan  ASA: II  Anesthesia Plan: General   Post-op Pain Management:    Induction: Intravenous  PONV Risk Score and Plan: 4 or greater and Ondansetron, Propofol infusion and TIVA  Airway Management Planned: Nasal Cannula  Additional Equipment: None  Intra-op Plan:   Post-operative Plan:   Informed Consent: I have reviewed the patients History and Physical, chart, labs and discussed the procedure including the risks, benefits and alternatives for the proposed anesthesia with the patient or authorized representative who has indicated his/her understanding and acceptance.     Dental advisory given  Plan Discussed with: CRNA and Surgeon  Anesthesia Plan Comments: (Discussed risks  of anesthesia with patient, including possibility of difficulty with spontaneous ventilation under anesthesia necessitating airway intervention, PONV, and rare risks such as cardiac or respiratory or neurological events. Patient understands.)        Anesthesia Quick Evaluation

## 2020-03-12 NOTE — Anesthesia Postprocedure Evaluation (Signed)
Anesthesia Post Note  Patient: Jacqueline Torres  Procedure(s) Performed: COLONOSCOPY WITH PROPOFOL (N/A )  Patient location during evaluation: Endoscopy Anesthesia Type: General Level of consciousness: awake and alert Pain management: pain level controlled Vital Signs Assessment: post-procedure vital signs reviewed and stable Respiratory status: spontaneous breathing, nonlabored ventilation, respiratory function stable and patient connected to nasal cannula oxygen Cardiovascular status: blood pressure returned to baseline and stable Postop Assessment: no apparent nausea or vomiting Anesthetic complications: no     Last Vitals:  Vitals:   03/12/20 0928 03/12/20 0938  BP: 140/80 (!) 142/80  Pulse: 81 72  Resp: 17 17  Temp:    SpO2: 100% 99%    Last Pain:  Vitals:   03/12/20 0938  TempSrc:   PainSc: 3                  Corinda Gubler

## 2020-03-12 NOTE — Anesthesia Procedure Notes (Signed)
Date/Time: 03/12/2020 8:18 AM Performed by: Stormy Fabian, CRNA Pre-anesthesia Checklist: Patient identified, Emergency Drugs available, Suction available and Patient being monitored Patient Re-evaluated:Patient Re-evaluated prior to induction Oxygen Delivery Method: Nasal cannula Induction Type: IV induction Dental Injury: Teeth and Oropharynx as per pre-operative assessment  Comments: Nasal cannula with etCO2 monitoring

## 2020-03-12 NOTE — OR Nursing (Signed)
Spoke with Dr. Suzan Slick, OK to proceed with procedure without urine pregnancy test.

## 2020-03-13 LAB — SURGICAL PATHOLOGY

## 2020-03-14 ENCOUNTER — Encounter: Payer: Self-pay | Admitting: *Deleted

## 2020-03-21 ENCOUNTER — Encounter: Payer: Self-pay | Admitting: Gastroenterology

## 2021-01-15 ENCOUNTER — Encounter: Payer: Self-pay | Admitting: Obstetrics and Gynecology

## 2021-01-15 NOTE — Progress Notes (Signed)
PCP: [facility] [place]   Chief Complaint  Patient presents with  . Gynecologic Exam    No concerns    HPI:      Jacqueline Torres is a 55 y.o. No obstetric history on file. who LMP was No LMP recorded. Patient has had an ablation., presents today for her annual examination.  Her menses are occas light discoloration for a few days. Mom and sister were late with menopause. Pt is s/p endometrial ablation due to menorrhagia.  Dysmenorrhea none.   Sex activity: single partner, contraception - tubal ligation. She does not have vaginal dryness.  Last Pap: 11/16/18 Results were normal, neg HPV DNA. Hx of abn pap with colpo in distant past without tx.  Hx of STDs: none  Last mammogram: 01/04/19 Results were normal, repeat in 12 months.  There is no FH of breast cancer. There is no FH of ovarian cancer. The patient self-breast exams.  Colonoscopy: 5/21 with polyps; repeat due after 5 yrs, Dr. Tobi Bastos. FH colon cancer and crohn's.  Tobacco use: The patient denies current or previous tobacco use. Alcohol use: none  No drug use Exercise: min active   She does get adequate calcium and Vitamin D in her diet.  Normal labs 8/20   Past Medical History:  Diagnosis Date  . Colon polyps   . Menorrhagia     Past Surgical History:  Procedure Laterality Date  . ABLATION  2005  . BLADDER SURGERY  2003  . COLONOSCOPY WITH PROPOFOL N/A 03/12/2020   repeat after 5 yrs; Procedure: COLONOSCOPY WITH PROPOFOL;  Surgeon: Wyline Mood, MD;  Location: [facility] ENDOSCOPY;  Service: Gastroenterology;  Laterality: N/A;  . TONSILLECTOMY     pt was 55 yrs old    Family History  Problem Relation Age of Onset  . Cancer Father        leomyosarcoma  . Colon cancer Maternal Aunt 50  . Crohn's disease Daughter   . Breast cancer Neg Hx   . Ovarian cancer Neg Hx     Social History   Socioeconomic History  . Marital status: Divorced    Spouse name: Not on file  . Number of children: Not on file  .  Years of education: Not on file  . Highest education level: Not on file  Occupational History  . Not on file  Tobacco Use  . Smoking status: Never Smoker  . Smokeless tobacco: Never Used  Vaping Use  . Vaping Use: Never used  Substance and Sexual Activity  . Alcohol use: Never  . Drug use: Never  . Sexual activity: Yes    Birth control/protection: Surgical    Comment: Ablation  Other Topics Concern  . Not on file  Social History Narrative  . Not on file   Social Determinants of Health   Financial Resource Strain: Not on file  Food Insecurity: Not on file  Transportation Needs: Not on file  Physical Activity: Not on file  Stress: Not on file  Social Connections: Not on file  Intimate Partner Violence: Not on file    No outpatient medications prior to visit.   No facility-administered medications prior to visit.     ROS:  Review of Systems  Constitutional: Negative for fatigue, fever and unexpected weight change.  Respiratory: Negative for cough, shortness of breath and wheezing.   Cardiovascular: Negative for chest pain, palpitations and leg swelling.  Gastrointestinal: Negative for blood in stool, constipation, diarrhea, nausea and vomiting.  Endocrine: Negative for  cold intolerance, heat intolerance and polyuria.  Genitourinary: Negative for dyspareunia, dysuria, flank pain, frequency, genital sores, hematuria, menstrual problem, pelvic pain, urgency, vaginal bleeding, vaginal discharge and vaginal pain.  Musculoskeletal: Negative for back pain, joint swelling and myalgias.  Skin: Negative for rash.  Allergic/Immunologic: Positive for environmental allergies.  Neurological: Negative for dizziness, syncope, light-headedness, numbness and headaches.  Hematological: Negative for adenopathy.  Psychiatric/Behavioral: Negative for agitation, confusion, sleep disturbance and suicidal ideas. The patient is not nervous/anxious.    BREAST: No symptoms    Objective: BP  110/80   Ht 5\' 8"  (1.727 m)   Wt 195 lb (88.5 kg)   BMI 29.65 kg/m    Physical Exam Constitutional:      Appearance: She is well-developed.  Genitourinary:     Vulva and rectum normal.     Right Labia: No rash, tenderness or lesions.    Left Labia: No tenderness, lesions or rash.    No vaginal discharge, erythema or tenderness.      Right Adnexa: not tender and no mass present.    Left Adnexa: not tender and no mass present.    No cervical friability or polyp.     Uterus is not enlarged or tender.  Rectum:     Guaiac result negative.     No anal fissure.  Breasts:     Right: No mass, nipple discharge, skin change or tenderness.     Left: No mass, nipple discharge, skin change or tenderness.    Neck:     Thyroid: No thyromegaly.  Cardiovascular:     Rate and Rhythm: Normal rate and regular rhythm.     Heart sounds: Normal heart sounds. No murmur heard.   Pulmonary:     Effort: Pulmonary effort is normal.     Breath sounds: Normal breath sounds.  Abdominal:     Palpations: Abdomen is soft.     Tenderness: There is no abdominal tenderness. There is no guarding or rebound.  Musculoskeletal:        General: Normal range of motion.     Cervical back: Normal range of motion.  Lymphadenopathy:     Cervical: No cervical adenopathy.  Neurological:     General: No focal deficit present.     Mental Status: She is alert and oriented to person, place, and time.     Cranial Nerves: No cranial nerve deficit.  Skin:    General: Skin is warm and dry.  Psychiatric:        Mood and Affect: Mood normal.        Behavior: Behavior normal.        Thought Content: Thought content normal.        Judgment: Judgment normal.  Vitals reviewed.     Assessment/Plan:  Encounter for annual routine gynecological examination  Encounter for screening mammogram for malignant neoplasm of breast - Plan: MM 3D SCREEN BREAST BILATERAL; pt to sched mammo          GYN counsel breast self  exam, mammography screening, menopause, adequate intake of calcium and vitamin D, diet and exercise    F/U  Return in about 1 year (around 01/16/2022).  Jaicee Michelotti B. Tripp Goins, PA-C 01/16/2021 9:23 AM

## 2021-01-15 NOTE — Patient Instructions (Addendum)
I value your feedback and you entrusting us with your care. If you get a [facility] patient survey, I would appreciate you taking the time to let us know about your experience today. Thank you!  [facility]: [phone]      

## 2021-01-16 ENCOUNTER — Ambulatory Visit (INDEPENDENT_AMBULATORY_CARE_PROVIDER_SITE_OTHER): Payer: 59 | Admitting: Obstetrics and Gynecology

## 2021-01-16 ENCOUNTER — Encounter: Payer: Self-pay | Admitting: Obstetrics and Gynecology

## 2021-01-16 ENCOUNTER — Other Ambulatory Visit: Payer: Self-pay

## 2021-01-16 VITALS — BP 110/80 | Ht 68.0 in | Wt 195.0 lb

## 2021-01-16 DIAGNOSIS — Z01419 Encounter for gynecological examination (general) (routine) without abnormal findings: Secondary | ICD-10-CM

## 2021-01-16 DIAGNOSIS — Z1231 Encounter for screening mammogram for malignant neoplasm of breast: Secondary | ICD-10-CM

## 2021-08-01 ENCOUNTER — Ambulatory Visit: Payer: 59 | Admitting: Adult Health

## 2021-10-25 ENCOUNTER — Telehealth: Payer: Self-pay | Admitting: Family Medicine

## 2021-10-25 NOTE — Telephone Encounter (Signed)
Lm to call office to schedule new patient appointment with [facility].

## 2022-02-04 ENCOUNTER — Encounter: Payer: Self-pay | Admitting: Family Medicine

## 2022-02-04 ENCOUNTER — Ambulatory Visit (INDEPENDENT_AMBULATORY_CARE_PROVIDER_SITE_OTHER): Payer: 59 | Admitting: Family Medicine

## 2022-02-04 VITALS — BP 140/80 | Ht 68.0 in | Wt 198.0 lb

## 2022-02-04 DIAGNOSIS — S3141XA Laceration without foreign body of vagina and vulva, initial encounter: Secondary | ICD-10-CM

## 2022-02-04 DIAGNOSIS — N950 Postmenopausal bleeding: Secondary | ICD-10-CM | POA: Diagnosis not present

## 2022-02-04 NOTE — Progress Notes (Signed)
? ?  GYNECOLOGY PROBLEM  VISIT ENCOUNTER NOTE ? ?Subjective:  ? Jacqueline Torres is a 56 y.o. [id number] female here for a problem GYN visit.  Current complaints: bleeding after sex but then reports bleeding now outside of sexual context. Reports some bleeding after sex for 1 year. Then on Tuesday had typical sexual experience, not rougher than normal and on Wednesday had moderate flow with swelling. Reports "feeling like I had a baby" and reports a prior history of episiotomy. History of ablation 18 years ago.   FSH in 2020 confirmed she has gone through menopause. Denies abnormal vaginal bleeding, discharge, pelvic pain, problems with intercourse or other gynecologic concerns.  ?  ?Gynecologic History ?No LMP recorded. Patient has had an ablation. ? ?Contraception: post menopausal status ? ?Health Maintenance Due  ?Topic Date Due  ? HIV Screening  Never done  ? Hepatitis C Screening  Never done  ? TETANUS/TDAP  Never done  ? Zoster Vaccines- Shingrix (1 of 2) Never done  ? COVID-19 Vaccine (3 - Booster for Pfizer series) 04/04/2020  ? MAMMOGRAM  01/03/2021  ? PAP SMEAR-Modifier  11/16/2021  ? ? ?The following portions of the patient's history were reviewed and updated as appropriate: allergies, current medications, past family history, past medical history, past social history, past surgical history and problem list. ? ?Review of Systems ?Pertinent items are noted in HPI. ?  ?Objective:  ?BP 140/80   Ht 5\' 8"  (1.727 m)   Wt 198 lb (89.8 kg)   BMI 30.11 kg/m?  ?Gen: well appearing, NAD ?HEENT: no scleral icterus ?CV: RR ?Lung: Normal WOB ?Ext: warm well perfused ? ?PELVIC: Normal appearing external genitalia; 1cm disruption of the perineum, midline and extends to the right with an 25mm opening with bleeding and irritation. Normal appearing vaginal mucosa and cervix with normal atrophy.  No abnormal discharge noted.   Normal uterine size, no other palpable masses, no uterine or adnexal tenderness. ? ? ?Assessment and  Plan:  ? ?1. Postmenopausal bleeding ?Will get 3m for precaution unlikely uterine cancer. ?Will base decision on EMB on findings seen on [place]. Discussed with patient option for EMB today and declined which is reasonable since more likely cause has been determined with laceration per below.  ?- US PELVIS (TRANSABDOMINAL ONLY); Future ? ?2. Non-obstetric vaginal laceration with perineal laceration without foreign body, initial encounter ?Suspect this is the cause of bleeding and appears proximate to episiotomy scar ?Recommended coconut oil, ice and rest ?Also discussed use of oil based lubricant/coconut oil for lubricant when she resumes sexual activity ? ? ?Please refer to After Visit Summary for other counseling recommendations.  ? ?Return if symptoms worsen or fail to improve. ? ?[place], MD, MPH, ABFM ?Attending Physician ?Faculty [facility]- [facility] ? ?

## 2022-02-05 ENCOUNTER — Telehealth: Payer: Self-pay | Admitting: Family Medicine

## 2022-02-05 NOTE — Telephone Encounter (Signed)
Called pt to schedule [place].  Left message for pt to call back.  Availability on May 11 for [place]. ?

## 2022-03-06 ENCOUNTER — Ambulatory Visit: Payer: 59

## 2022-03-06 DIAGNOSIS — N950 Postmenopausal bleeding: Secondary | ICD-10-CM

## 2022-03-27 ENCOUNTER — Emergency Department
Admission: EM | Admit: 2022-03-27 | Discharge: 2022-03-27 | Disposition: A | Discharge status: Home / Self Care | Payer: Managed Care, Other (non HMO) | Attending: Emergency Medicine | Admitting: Emergency Medicine

## 2022-03-27 ENCOUNTER — Emergency Department: Payer: Managed Care, Other (non HMO)

## 2022-03-27 ENCOUNTER — Other Ambulatory Visit: Payer: Self-pay

## 2022-03-27 ENCOUNTER — Ambulatory Visit: Admission: EM | Admit: 2022-03-27 | Discharge: 2022-03-27 | Payer: No Typology Code available for payment source

## 2022-03-27 ENCOUNTER — Encounter: Payer: Self-pay | Admitting: Emergency Medicine

## 2022-03-27 DIAGNOSIS — R42 Dizziness and giddiness: Secondary | ICD-10-CM

## 2022-03-27 DIAGNOSIS — R110 Nausea: Secondary | ICD-10-CM | POA: Insufficient documentation

## 2022-03-27 DIAGNOSIS — Z7409 Other reduced mobility: Secondary | ICD-10-CM

## 2022-03-27 LAB — BASIC METABOLIC PANEL
Anion gap: 5 (ref 5–15)
BUN: 13 mg/dL (ref 6–20)
CO2: 28 mmol/L (ref 22–32)
Calcium: 9.5 mg/dL (ref 8.9–10.3)
Chloride: 107 mmol/L (ref 98–111)
Creatinine, Ser: 0.84 mg/dL (ref 0.44–1.00)
GFR, Estimated: >60 mL/min (ref >60)
Glucose, Bld: 123 mg/dL — ABNORMAL HIGH (ref 70–99)
Potassium: 3.8 mmol/L (ref 3.5–5.1)
Sodium: 140 mmol/L (ref 135–145)

## 2022-03-27 LAB — CBC
HCT: 50.4 % — ABNORMAL HIGH (ref 36.0–46.0)
Hemoglobin: 16.3 g/dL — ABNORMAL HIGH (ref 12.0–15.0)
MCH: 28 pg (ref 26.0–34.0)
MCHC: 32.3 g/dL (ref 30.0–36.0)
MCV: 86.4 fL (ref 80.0–100.0)
Platelets: 224 10*3/uL (ref 150–400)
RBC: 5.83 MIL/uL — ABNORMAL HIGH (ref 3.87–5.11)
RDW: 13 % (ref 11.5–15.5)
WBC: 5.7 10*3/uL (ref 4.0–10.5)
nRBC: 0 % (ref 0.0–0.2)

## 2022-03-27 LAB — URINALYSIS, ROUTINE W REFLEX MICROSCOPIC
Bilirubin Urine: NEGATIVE
Glucose, UA: NEGATIVE mg/dL
Ketones, ur: NEGATIVE mg/dL
Nitrite: NEGATIVE
Protein, ur: NEGATIVE mg/dL
Specific Gravity, Urine: 1.023 (ref 1.005–1.030)
WBC, UA: >50 WBC/hpf — ABNORMAL HIGH (ref 0–5)
pH: 6 (ref 5.0–8.0)

## 2022-03-27 MED ORDER — MECLIZINE HCL 25 MG PO TABS
25.0000 mg | ORAL_TABLET | Freq: Once | ORAL | Status: AC
  Administered 2022-03-27: 25 mg via ORAL
  Filled 2022-03-27: qty 1

## 2022-03-27 MED ORDER — SODIUM CHLORIDE 0.9 % IV SOLN
1000.0000 mL | Freq: Once | INTRAVENOUS | Status: AC
  Administered 2022-03-27: 1000 mL via INTRAVENOUS

## 2022-03-27 MED ORDER — ONDANSETRON 4 MG PO TBDP: 4.0000 mg | ORAL_TABLET | Freq: Three times a day (TID) | ORAL | 0 refills | Status: DC | PRN

## 2022-03-27 MED ORDER — MECLIZINE HCL 25 MG PO TABS: 25.0000 mg | ORAL_TABLET | Freq: Three times a day (TID) | ORAL | 0 refills | Status: DC | PRN

## 2022-03-27 NOTE — ED Provider Notes (Signed)
[facility] Provider Note    Event Date/Time   First MD Initiated Contact with Patient 03/27/22 1011     (approximate)   History   Dizziness   HPI  Alexia Dinger is a 56 y.o. female who presents with complaints of dizziness.  Patient reports symptoms started when she went to bed several days ago, she reports it continued in the morning.  She is able to ambulate but feels unsteady.  She does have nausea related to vertigo when she is very symptomatic.  No headache, no ringing in the ears/tinnitus     Physical Exam   Triage Vital Signs: ED Triage Vitals  Enc Vitals Group     BP 03/27/22 0926 (!) 145/98     Pulse Rate 03/27/22 0926 75     Resp 03/27/22 0926 16     Temp 03/27/22 0926 98.3 F (36.8 C)     Temp Source 03/27/22 0926 Oral     SpO2 03/27/22 0926 95 %     Weight 03/27/22 0923 88.5 kg (195 lb)     Height 03/27/22 0923 1.727 m (5\' 8" )     Head Circumference --      Peak Flow --      Pain Score 03/27/22 0923 2     Pain Loc --      Pain Edu? --      Excl. in GC? --     Most recent vital signs: Vitals:   03/27/22 1100 03/27/22 1126  BP: 140/89 139/85  Pulse: 67 66  Resp: 14 15  Temp:    SpO2: 94% 96%     General: Awake, no distress.  CV:  Good peripheral perfusion.  Resp:  Normal effort.  Abd:  No distention.  Other:  Cranial nerves II to XII are normal PERRLA, EOMI   ED Results / Procedures / Treatments   Labs (all labs ordered are listed, but only abnormal results are displayed) Labs Reviewed  BASIC METABOLIC PANEL - Abnormal; Notable for the following components:      Result Value   Glucose, Bld 123 (*)    All other components within normal limits  CBC - Abnormal; Notable for the following components:   RBC 5.83 (*)    Hemoglobin 16.3 (*)    HCT 50.4 (*)    All other components within normal limits  URINALYSIS, ROUTINE W REFLEX MICROSCOPIC - Abnormal; Notable for the following components:   Color, Urine YELLOW  (*)    APPearance HAZY (*)    Hgb urine dipstick MODERATE (*)    Leukocytes,Ua LARGE (*)    WBC, UA >50 (*)    Bacteria, UA RARE (*)    All other components within normal limits  CBG MONITORING, ED  POC URINE PREG, ED     EKG  ED ECG REPORT I, 05/27/22, the attending physician, personally viewed and interpreted this ECG.  Date: 03/27/2022  Rhythm: normal sinus rhythm QRS Axis: normal Intervals: normal ST/T Wave abnormalities: normal Narrative Interpretation: no evidence of acute ischemia    RADIOLOGY CT head viewed interpreted by me, no acute abnormality    PROCEDURES:  Critical Care performed:   Procedures   MEDICATIONS ORDERED IN ED: Medications  0.9 %  sodium chloride infusion (0 mLs Intravenous Stopped 03/27/22 1233)  meclizine (ANTIVERT) tablet 25 mg (25 mg Oral Given 03/27/22 1039)     IMPRESSION / MDM / ASSESSMENT AND PLAN / ED COURSE  I reviewed the triage vital  signs and the nursing notes. Patient's presentation is most consistent with acute presentation with potential threat to life or bodily function.  Patient presents with vertigo as detailed above  Differential includes benign positional vertigo, central vertigo/CVA, electrolyte abnormality  Lab work reviewed and is reassuring, BMP CBC unremarkable.  Glucose normal.  CT head without acute abnormality  Sent for MRI brain to rule out CVA, treated with IV fluids, meclizine with some improvement in symptoms  Offered admission however patient is feeling improved and declines MRI brain negative, will refer the patient to ENT        FINAL CLINICAL IMPRESSION(S) / ED DIAGNOSES   Final diagnoses:  Vertigo     Rx / DC Orders   ED Discharge Orders          Ordered    meclizine (ANTIVERT) 25 MG tablet  3 times daily PRN        03/27/22 1218    ondansetron (ZOFRAN-ODT) 4 MG disintegrating tablet  Every 8 hours PRN        03/27/22 1218             Note:  This document was  prepared using Dragon voice recognition software and may include unintentional dictation errors.   Jene Every, MD 03/27/22 1310

## 2022-03-27 NOTE — ED Notes (Signed)
Patient is being discharged from the Urgent Care and sent to the Emergency Department via personal vehicle with her son . Per Jerrilyn Cairo NP, patient is in need of higher level of care due to Dizziness with abnormal gait. Patient is aware and verbalizes understanding of plan of care.  Vitals:   03/27/22 0853 03/27/22 0857  BP: (!) 141/93   Pulse: 91   Resp: 18   Temp:  97.6 F (36.4 C)  SpO2: 97%

## 2022-03-27 NOTE — ED Notes (Signed)
Patient back from MRI.

## 2022-03-27 NOTE — ED Notes (Signed)
Patient transported to MRI 

## 2022-03-27 NOTE — ED Triage Notes (Signed)
Pt here with vertigo that started on Mon. Pt denies injury, weakness, or numbness. Pt had a headache yesterday.

## 2022-03-27 NOTE — ED Provider Notes (Signed)
Patient has a 4 days history of worsening dizziness. Arrived to UC experiencing difficulty with mobility due to dizziness. She has had nausea and vomiting 4 days and headache yesterday. No headache today. One episode of vertigo several years ago, never his long or extreme. Given patient's age, current presentation, and length of time symptoms present, patient warrants further work-up and evaluation.   Bing Neighbors, [place] 03/27/22 [phone]

## 2022-03-27 NOTE — ED Triage Notes (Signed)
Pt presents with dizziness that started 4 days ago. She feels like the room is spinning and she does have nausea and vomiting. Pt reports a HA yesterday but none today.

## 2022-04-10 ENCOUNTER — Ambulatory Visit: Payer: 59 | Admitting: Family

## 2022-04-14 ENCOUNTER — Encounter: Payer: Self-pay | Admitting: Emergency Medicine

## 2022-04-14 ENCOUNTER — Ambulatory Visit
Admission: EM | Admit: 2022-04-14 | Discharge: 2022-04-14 | Disposition: A | Discharge status: Home / Self Care | Payer: 59 | Attending: Family Medicine | Admitting: Family Medicine

## 2022-04-14 DIAGNOSIS — J019 Acute sinusitis, unspecified: Secondary | ICD-10-CM | POA: Diagnosis not present

## 2022-04-14 MED ORDER — AMOXICILLIN-POT CLAVULANATE 875-125 MG PO TABS
1.0000 | ORAL_TABLET | Freq: Two times a day (BID) | ORAL | 0 refills | Status: AC
Start: 2022-04-14 — End: 2022-04-24

## 2022-04-14 MED ORDER — FLUCONAZOLE 150 MG PO TABS: 150.0000 mg | ORAL_TABLET | Freq: Every day | ORAL | 0 refills | Status: DC

## 2022-04-14 NOTE — Discharge Instructions (Signed)
Discontinue benadryl. Continue allegra and you may take 1/2 of cetrizine at bedtime as needed for nasal symptoms. Keep follow-up with ENT.

## 2022-04-14 NOTE — ED Triage Notes (Signed)
Pt presents with sinus pressure/pain, congestion and she feels like she has fluid in her ears.

## 2022-04-14 NOTE — ED Provider Notes (Signed)
Renaldo Fiddler    CSN: [id number] Arrival date & time: 04/14/22  1714      History   Chief Complaint Chief Complaint  Patient presents with   Sinus Pressure    HPI Jacqueline Torres is a 56 y.o. female.   HPI Patient with a recent history of  recent recurrent vertigo, presents today for evaluation of ongoing sinus symptoms including frontal headache, sinus pressure, postnasal drainage, and ear fullness. Patient presented here at [facility] 03/27/22 with severe dizziness and unstable gait due to vertigo. At the time she also had mild URI symptoms present. She reports URI symptoms have never improved despite daily allergy medication and sudafed. She had another episode of vertigo 2 days ago and now has facial pressure, jaw pain, congestion, inner ear fullness. No fever. No other associated symptoms. No current vertigo symptoms.  Past Medical History:  Diagnosis Date   Colon polyps    Menorrhagia     There are no problems to display for this patient.   Past Surgical History:  Procedure Laterality Date   ABLATION  2005   BLADDER SURGERY  2003   COLONOSCOPY WITH PROPOFOL N/A 03/12/2020   repeat after 5 yrs; Procedure: COLONOSCOPY WITH PROPOFOL;  Surgeon: Wyline Mood, MD;  Location: [facility] ENDOSCOPY;  Service: Gastroenterology;  Laterality: N/A;   TONSILLECTOMY     pt was 56 yrs old    OB History     Gravida  5   Para  4   Term  4   Preterm      AB  1   Living  4      SAB  1   IAB      Ectopic      Multiple      Live Births  4            Home Medications    Prior to Admission medications   Medication Sig Start Date End Date Taking? Authorizing Provider  amoxicillin-clavulanate (AUGMENTIN) 875-125 MG tablet Take 1 tablet by mouth 2 (two) times daily for 10 days. 04/14/22 04/24/22 Yes Bing Neighbors, FNP  fluconazole (DIFLUCAN) 150 MG tablet Take 1 tablet (150 mg total) by mouth daily. 04/14/22  Yes Bing Neighbors, FNP  meclizine (ANTIVERT) 25 MG  tablet Take 1 tablet (25 mg total) by mouth 3 (three) times daily as needed for dizziness. 03/27/22   Jene Every, MD  ondansetron (ZOFRAN-ODT) 4 MG disintegrating tablet Take 1 tablet (4 mg total) by mouth every 8 (eight) hours as needed for nausea or vomiting. 03/27/22   Jene Every, MD    Family History Family History  Problem Relation Age of Onset   Cancer Father        leomyosarcoma   Colon cancer Maternal Aunt 25   Crohn's disease Daughter    Breast cancer Neg Hx    Ovarian cancer Neg Hx     Social History Social History   Tobacco Use   Smoking status: Never   Smokeless tobacco: Never  Vaping Use   Vaping Use: Never used  Substance Use Topics   Alcohol use: Never   Drug use: Never     Allergies   Sulfa antibiotics   Review of Systems Review of Systems Pertinent negatives listed in HPI   Physical Exam Triage Vital Signs ED Triage Vitals  Enc Vitals Group     BP 04/14/22 1742 135/90     Pulse Rate 04/14/22 1742 74     Resp 04/14/22  1742 16     Temp 04/14/22 1742 98.4 F (36.9 C)     Temp Source 04/14/22 1742 Oral     SpO2 04/14/22 1742 98 %     Weight --      Height --      Head Circumference --      Peak Flow --      Pain Score 04/14/22 1741 0     Pain Loc --      Pain Edu? --      Excl. in GC? --    No data found.  Updated Vital Signs BP 135/90 (BP Location: Left Arm)   Pulse 74   Temp 98.4 F (36.9 C) (Oral)   Resp 16   SpO2 98%   Visual Acuity Right Eye Distance:   Left Eye Distance:   Bilateral Distance:    Right Eye Near:   Left Eye Near:    Bilateral Near:     Physical Exam   General Appearance:    Alert, cooperative, no distress  HENT:   Normocephalic, TM w/o erythema or fluid, nares mucosal edema with congestion, negative rhinorrhea, oropharynx  w/o erythema or exudate  Eyes:    PERRL, conjunctiva/corneas clear, EOM's intact       Lungs:     Clear to auscultation bilaterally, respirations unlabored  Heart:    Regular  rate and rhythm  Neurologic:   Awake, alert, oriented x 3. No apparent focal neurological           defect.     UC Treatments / Results  Labs (all labs ordered are listed, but only abnormal results are displayed) Labs Reviewed - No data to display  EKG   Radiology No results found.  Procedures Procedures (including critical care time)  Medications Ordered in UC Medications - No data to display  Initial Impression / Assessment and Plan / UC Course  I have reviewed the triage vital signs and the nursing notes.  Pertinent labs & imaging results that were available during my care of the patient were reviewed by me and considered in my medical decision making (see chart for details).    Acute Non-recurrent Sinusitis  Discontinue benadryl this may be exacerbated dizziness. Recommend 1/2 dose of cetrizine at bedtime instead of benadryl.  Continue AM Allegra. Complete course of antibiotics. Keep follow appointment with ENT. Return as needed.  Final Clinical Impressions(s) / UC Diagnoses   Final diagnoses:  Acute non-recurrent sinusitis, unspecified location     Discharge Instructions      Discontinue benadryl. Continue allegra and you may take 1/2 of cetrizine at bedtime as needed for nasal symptoms. Keep follow-up with ENT.    ED Prescriptions     Medication Sig Dispense Auth. Provider   amoxicillin-clavulanate (AUGMENTIN) 875-125 MG tablet Take 1 tablet by mouth 2 (two) times daily for 10 days. 20 tablet Bing Neighbors, FNP   fluconazole (DIFLUCAN) 150 MG tablet Take 1 tablet (150 mg total) by mouth daily. 2 tablet Bing Neighbors, FNP      PDMP not reviewed this encounter.   Bing Neighbors, [place] 04/15/22 [phone]

## 2022-04-25 ENCOUNTER — Other Ambulatory Visit: Payer: Self-pay | Admitting: Otolaryngology

## 2022-04-25 DIAGNOSIS — E041 Nontoxic single thyroid nodule: Secondary | ICD-10-CM

## 2022-05-01 ENCOUNTER — Ambulatory Visit
Admission: RE | Admit: 2022-05-01 | Discharge: 2022-05-01 | Disposition: A | Discharge status: Home / Self Care | Payer: 59 | Source: Ambulatory Visit | Attending: Otolaryngology | Admitting: Otolaryngology

## 2022-05-01 DIAGNOSIS — E041 Nontoxic single thyroid nodule: Secondary | ICD-10-CM

## 2022-05-16 ENCOUNTER — Other Ambulatory Visit: Payer: Self-pay | Admitting: Otolaryngology

## 2022-05-16 DIAGNOSIS — E041 Nontoxic single thyroid nodule: Secondary | ICD-10-CM

## 2022-05-23 ENCOUNTER — Ambulatory Visit (INDEPENDENT_AMBULATORY_CARE_PROVIDER_SITE_OTHER): Payer: 59 | Admitting: Family

## 2022-05-23 ENCOUNTER — Encounter: Payer: Self-pay | Admitting: Family

## 2022-05-23 VITALS — BP 138/84 | HR 74 | Temp 98.6°F | Resp 16 | Ht 68.0 in | Wt 191.4 lb

## 2022-05-23 DIAGNOSIS — E041 Nontoxic single thyroid nodule: Secondary | ICD-10-CM

## 2022-05-23 DIAGNOSIS — R030 Elevated blood-pressure reading, without diagnosis of hypertension: Secondary | ICD-10-CM | POA: Insufficient documentation

## 2022-05-23 DIAGNOSIS — R682 Dry mouth, unspecified: Secondary | ICD-10-CM

## 2022-05-23 DIAGNOSIS — D582 Other hemoglobinopathies: Secondary | ICD-10-CM

## 2022-05-23 DIAGNOSIS — R42 Dizziness and giddiness: Secondary | ICD-10-CM | POA: Diagnosis not present

## 2022-05-23 DIAGNOSIS — Z13220 Encounter for screening for lipoid disorders: Secondary | ICD-10-CM

## 2022-05-23 DIAGNOSIS — E559 Vitamin D deficiency, unspecified: Secondary | ICD-10-CM

## 2022-05-23 DIAGNOSIS — Z1231 Encounter for screening mammogram for malignant neoplasm of breast: Secondary | ICD-10-CM

## 2022-05-23 DIAGNOSIS — H04123 Dry eye syndrome of bilateral lacrimal glands: Secondary | ICD-10-CM

## 2022-05-23 DIAGNOSIS — R739 Hyperglycemia, unspecified: Secondary | ICD-10-CM

## 2022-05-23 DIAGNOSIS — Z7689 Persons encountering health services in other specified circumstances: Secondary | ICD-10-CM

## 2022-05-23 DIAGNOSIS — M2550 Pain in unspecified joint: Secondary | ICD-10-CM

## 2022-05-23 NOTE — Assessment & Plan Note (Signed)
Likely will start losartan 50 mg once daily however pending results of cmp  Pt advised of the following:  Continue medication as prescribed. Monitor blood pressure periodically and/or when you feel symptomatic. Goal is <130/90 on average. Ensure that you have rested for 30 minutes prior to checking your blood pressure. Record your readings and bring them to your next visit if necessary.work on a low sodium diet.

## 2022-05-23 NOTE — Assessment & Plan Note (Signed)
Cont f/u with ent  If neg workup consider cardiology Lab workup in progress

## 2022-05-23 NOTE — Assessment & Plan Note (Signed)
Autoimmune workup ordered pending results

## 2022-05-23 NOTE — Assessment & Plan Note (Signed)
Ordered vitamin d pending results.   

## 2022-05-23 NOTE — Assessment & Plan Note (Signed)
Ordering a1c pending results

## 2022-05-23 NOTE — Assessment & Plan Note (Signed)
Reviewed u/s  Next u/s thyroid due 04/2023 Ordering thyrod panel to assess for dysfunction and or hashimotos

## 2022-05-23 NOTE — Assessment & Plan Note (Signed)
Consider [name] today to start workup

## 2022-05-23 NOTE — Assessment & Plan Note (Signed)
Mammogram ordered. Pending results. 

## 2022-05-23 NOTE — Progress Notes (Signed)
New Patient Office Visit  Subjective:  Patient ID: Jacqueline Torres, female    DOB: 1966/04/15  Age: 56 y.o. MRN: [id number]  CC:  Chief Complaint  Patient presents with   Establish Care    HPI Jacqueline Torres is here to establish care as a new patient.  Prior provider was: has not had a pcp recently  Obgyn alicia copland  Ent  Dr Elton Sin Pt is without acute concerns.   Pap 11/16/18 negative  Colonoscopy 03/12/20 , repeat five years, with polyp   Elevated blood pressure: has been 130/140 over 80 or so. She has been checking this at home. Sometimes goes up to 90's diastolic dad had been on blood pressure medication.  Sister also.  Ekg- NSR with arrhythmia   Polyarthralgia, bil hips, ankles, shoulders seem to be worse. Hard to sleep at night will wake up in pain. Vertigo hits her when in pain as well. Stiffness in the am, < 1 hour to work out. She does have daughter with chrohns.   chronic concerns:  Vertigo: memorial day weekend, room started spinning x one week. Went to urgent care, was sent to Er. Ct scan and MRI brain, unremarkable per pt. Vertigo improved but then came back again while at work. No severe attack since but every night upon lying down with vertigo, which she describes as tunnel vision. Sun and heat worsen it as well. Has f/u with ent next week with epley maneuver. Given rx steroid in case she had an attack prn.   Vitamin d def , not currently taking anything for this.   Right mid thyroid nodule, needs surveillance once annually for five years.   Past Medical History:  Diagnosis Date   Colon polyps    Menorrhagia     Past Surgical History:  Procedure Laterality Date   ABLATION  2005   uterine ablation   BLADDER SURGERY  2003   bladder tac   COLONOSCOPY WITH PROPOFOL N/A 03/12/2020   repeat after 5 yrs; Procedure: COLONOSCOPY WITH PROPOFOL;  Surgeon: Wyline Mood, MD;  Location: [facility] ENDOSCOPY;  Service: Gastroenterology;  Laterality: N/A;   TONSILLECTOMY      pt was 56 yrs old    Family History  Problem Relation Age of Onset   Healthy Mother    Cancer Father        leomyosarcoma   Crohn's disease Daughter    Colon cancer Maternal Aunt 50   Breast cancer Neg Hx    Ovarian cancer Neg Hx     Social History   Socioeconomic History   Marital status: Divorced    Spouse name: Not on file   Number of children: 4   Years of education: Not on file   Highest education level: Not on file  Occupational History    Employer: [employer]    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  Tobacco Use   Smoking status: Never   Smokeless tobacco: Never  Vaping Use   Vaping Use: Never used  Substance and Sexual Activity   Alcohol use: Never   Drug use: Never   Sexual activity: Yes    Partners: Male    Birth control/protection: Post-menopausal  Other Topics Concern   Not on file  Social History Narrative   Not on file   Social Determinants of Health   Financial Resource Strain: Not on file  Food Insecurity: Not on file  Transportation Needs: Not on file  Physical Activity: Not on file  Stress: Not  on file  Social Connections: Not on file  Intimate Partner Violence: Not on file    Outpatient Medications Prior to Visit  Medication Sig Dispense Refill   fluconazole (DIFLUCAN) 150 MG tablet Take 1 tablet (150 mg total) by mouth daily. (Patient not taking: Reported on 05/23/2022) 2 tablet 0   meclizine (ANTIVERT) 25 MG tablet Take 1 tablet (25 mg total) by mouth 3 (three) times daily as needed for dizziness. (Patient not taking: Reported on 05/23/2022) 20 tablet 0   ondansetron (ZOFRAN-ODT) 4 MG disintegrating tablet Take 1 tablet (4 mg total) by mouth every 8 (eight) hours as needed for nausea or vomiting. (Patient not taking: Reported on 05/23/2022) 20 tablet 0   No facility-administered medications prior to visit.    Allergies  Allergen Reactions   Sulfa Antibiotics     Rash, pt feels like she is hungover.        Objective:     Physical Exam  Gen: NAD, resting comfortably CV: RRR with no murmurs appreciated Pulm: NWOB, CTAB with no crackles, wheezes, or rhonchi Skin: warm, dry Psych: Normal affect and thought content  BP 138/84   Pulse 74   Temp 98.6 F (37 C)   Resp 16   Ht 5\' 8"  (1.727 m)   Wt 191 lb 6 oz (86.8 kg)   SpO2 97%   BMI 29.10 kg/m  Wt Readings from Last 3 Encounters:  05/23/22 191 lb 6 oz (86.8 kg)  03/27/22 195 lb (88.5 kg)  02/04/22 198 lb (89.8 kg)     Health Maintenance Due  Topic Date Due   HIV Screening  Never done   Hepatitis C Screening  Never done   TETANUS/TDAP  Never done   Zoster Vaccines- Shingrix (1 of 2) Never done   COVID-19 Vaccine (3 - Pfizer series) 04/04/2020   MAMMOGRAM  01/03/2021   PAP SMEAR-Modifier  11/16/2021    There are no preventive care reminders to display for this patient.  Lab Results  Component Value Date   TSH 2.160 06/22/2019   Lab Results  Component Value Date   WBC 5.7 03/27/2022   HGB 16.3 (H) 03/27/2022   HCT 50.4 (H) 03/27/2022   MCV 86.4 03/27/2022   PLT 224 03/27/2022   Lab Results  Component Value Date   NA 140 03/27/2022   K 3.8 03/27/2022   CO2 28 03/27/2022   GLUCOSE 123 (H) 03/27/2022   BUN 13 03/27/2022   CREATININE 0.84 03/27/2022   BILITOT 0.4 06/22/2019   ALKPHOS 62 06/22/2019   AST 20 06/22/2019   ALT 24 06/22/2019   PROT 6.9 06/22/2019   ALBUMIN 4.7 06/22/2019   CALCIUM 9.5 03/27/2022   ANIONGAP 5 03/27/2022   Lab Results  Component Value Date   CHOL 191 06/22/2019   Lab Results  Component Value Date   HDL 74 06/22/2019   Lab Results  Component Value Date   LDLCALC 96 06/22/2019   Lab Results  Component Value Date   TRIG 106 06/22/2019   Lab Results  Component Value Date   CHOLHDL 2.6 06/22/2019   Lab Results  Component Value Date   HGBA1C 5.5 06/22/2019      Assessment & Plan:   Problem List Items Addressed This Visit       Digestive   Dry mouth    Consider [name]  today to start workup      Relevant Orders   Folate   Vitamin B12     Endocrine  Right thyroid nodule    Reviewed u/s  Next u/s thyroid due 04/2023 Ordering thyrod panel to assess for dysfunction and or hashimotos      Relevant Orders   Folate   Vitamin B12     Other   Vitamin D deficiency    Ordered vitamin d pending results.        Relevant Orders   Folate   Vitamin B12   VITAMIN D 25 Hydroxy (Vit-D Deficiency, Fractures)   Hyperglycemia    Ordering a1c pending results      Relevant Orders   Folate   Hemoglobin A1c   Vitamin B12   Screening for lipoid disorders    Lipid panel ordered pending results.        Relevant Orders   Folate   Lipid panel   Vitamin B12   Elevated hemoglobin (HCC)    Macrocytic, ordering b12 pending results      Relevant Orders   Folate   CBC with Differential   Vitamin B12   Vertigo - Primary    Cont f/u with ent  If neg workup consider cardiology Lab workup in progress      Relevant Orders   Folate   CBC with Differential   Comprehensive metabolic panel   C-reactive protein   Sedimentation rate   TSH   Vitamin B12   Elevated blood pressure reading    Likely will start losartan 50 mg once daily however pending results of cmp  Pt advised of the following:  Continue medication as prescribed. Monitor blood pressure periodically and/or when you feel symptomatic. Goal is <130/90 on average. Ensure that you have rested for 30 minutes prior to checking your blood pressure. Record your readings and bring them to your next visit if necessary.work on a low sodium diet.       Relevant Orders   Folate   Vitamin B12   Dry eyes   Relevant Orders   Folate   Vitamin B12   Polyarthralgia    Autoimmune workup ordered pending results      Relevant Orders   ANA   Rheumatoid factor   Folate   Cyclic citrul peptide antibody, IgG   Vitamin B12   Encounter for screening mammogram for malignant neoplasm of breast     Mammogram ordered. Pending results.       Relevant Orders   MM 3D SCREEN BREAST BILATERAL   Folate   Vitamin B12   Encounter to establish care    D/w with pt and went over chronic and acute history as indicated Went over family social and medical/surgical history  Discussed h/o lab results and or diagnostic testing as indicated         No orders of the defined types were placed in this encounter.   Follow-up: Return in about 1 month (around 06/23/2022) for with blood pressure log .    Mort Sawyers, FNP

## 2022-05-23 NOTE — Patient Instructions (Signed)
  Welcome to our clinic, I am happy to have you as my new patient. I am excited to continue on this healthcare journey with you.  Stop by the lab prior to leaving today. I will notify you of your results once received.   Please keep in mind Any my chart messages you send have up to a three business day turnaround for a response.  Phone calls may take up to a one full business day turnaround for a  response.   If you need a medication refill I recommend you request it through the pharmacy as this is easiest for us rather than sending a message and or phone call.   Due to recent changes in healthcare laws, you may see results of your imaging and/or laboratory studies on MyChart before I have had a chance to review them.  I understand that in some cases there may be results that are confusing or concerning to you. Please understand that not all results are received at the same time and often I may need to interpret multiple results in order to provide you with the best plan of care or course of treatment. Therefore, I ask that you please give me 2 business days to thoroughly review all your results before contacting my office for clarification. Should we see a critical lab result, you will be contacted sooner.   It was a pleasure seeing you today! Please do not hesitate to reach out with any questions and or concerns.  Regards,   Candus Braud FNP-C  

## 2022-05-23 NOTE — Assessment & Plan Note (Signed)
Macrocytic, ordering b12 pending results

## 2022-05-23 NOTE — Assessment & Plan Note (Signed)
Lipid panel ordered pending results.   

## 2022-05-23 NOTE — Assessment & Plan Note (Signed)
D/w with pt and went over chronic and acute history as indicated Went over family social and medical/surgical history  Discussed h/o lab results and or diagnostic testing as indicated   

## 2022-05-26 ENCOUNTER — Other Ambulatory Visit: Payer: Self-pay | Admitting: Family

## 2022-05-26 DIAGNOSIS — E559 Vitamin D deficiency, unspecified: Secondary | ICD-10-CM

## 2022-05-26 LAB — CBC WITH DIFFERENTIAL/PLATELET
Absolute Monocytes: 529 cells/uL (ref 200–950)
Basophils Absolute: 92 cells/uL (ref 0–200)
Basophils Relative: 1.7 %
Eosinophils Absolute: 238 cells/uL (ref 15–500)
Eosinophils Relative: 4.4 %
HCT: 41.9 % (ref 35.0–45.0)
Hemoglobin: 14.4 g/dL (ref 11.7–15.5)
Lymphs Abs: 1280 cells/uL (ref 850–3900)
MCH: 29.3 pg (ref 27.0–33.0)
MCHC: 34.4 g/dL (ref 32.0–36.0)
MCV: 85.2 fL (ref 80.0–100.0)
MPV: 10.4 fL (ref 7.5–12.5)
Monocytes Relative: 9.8 %
Neutro Abs: 3262 cells/uL (ref 1500–7800)
Neutrophils Relative %: 60.4 %
Platelets: 245 10*3/uL (ref 140–400)
RBC: 4.92 10*6/uL (ref 3.80–5.10)
RDW: 13.1 % (ref 11.0–15.0)
Total Lymphocyte: 23.7 %
WBC: 5.4 10*3/uL (ref 3.8–10.8)

## 2022-05-26 LAB — COMPREHENSIVE METABOLIC PANEL
AG Ratio: 1.7 (calc) (ref 1.0–2.5)
ALT: 29 U/L (ref 6–29)
AST: 20 U/L (ref 10–35)
Albumin: 4.3 g/dL (ref 3.6–5.1)
Alkaline phosphatase (APISO): 78 U/L (ref 37–153)
BUN: 16 mg/dL (ref 7–25)
CO2: 29 mmol/L (ref 20–32)
Calcium: 9.5 mg/dL (ref 8.6–10.4)
Chloride: 104 mmol/L (ref 98–110)
Creat: 0.89 mg/dL (ref 0.50–1.03)
Globulin: 2.5 g/dL (calc) (ref 1.9–3.7)
Glucose, Bld: 112 mg/dL — ABNORMAL HIGH (ref 65–99)
Potassium: 4 mmol/L (ref 3.5–5.3)
Sodium: 142 mmol/L (ref 135–146)
Total Bilirubin: 0.3 mg/dL (ref 0.2–1.2)
Total Protein: 6.8 g/dL (ref 6.1–8.1)

## 2022-05-26 LAB — HEMOGLOBIN A1C
Hgb A1c MFr Bld: 5.5 % of total Hgb (ref <5.7)
Mean Plasma Glucose: 111 mg/dL
eAG (mmol/L): 6.2 mmol/L

## 2022-05-26 LAB — CYCLIC CITRUL PEPTIDE ANTIBODY, IGG: Cyclic Citrullin Peptide Ab: <16 UNITS

## 2022-05-26 LAB — LIPID PANEL
Cholesterol: 184 mg/dL (ref <200)
HDL: 69 mg/dL (ref >=50)
LDL Cholesterol (Calc): 90 mg/dL (calc)
Non-HDL Cholesterol (Calc): 115 mg/dL (calc) (ref <130)
Total CHOL/HDL Ratio: 2.7 (calc) (ref <5.0)
Triglycerides: 150 mg/dL — ABNORMAL HIGH (ref <150)

## 2022-05-26 LAB — ANTI-NUCLEAR AB-TITER (ANA TITER): ANA Titer 1: 1:320 {titer} — ABNORMAL HIGH

## 2022-05-26 LAB — TSH: TSH: 1.3 mIU/L (ref 0.40–4.50)

## 2022-05-26 LAB — C-REACTIVE PROTEIN: CRP: 2.7 mg/L (ref <8.0)

## 2022-05-26 LAB — FOLATE: Folate: 16.8 ng/mL

## 2022-05-26 LAB — VITAMIN B12: Vitamin B-12: 425 pg/mL (ref 200–1100)

## 2022-05-26 LAB — VITAMIN D 25 HYDROXY (VIT D DEFICIENCY, FRACTURES): Vit D, 25-Hydroxy: 26 ng/mL — ABNORMAL LOW (ref 30–100)

## 2022-05-26 LAB — SEDIMENTATION RATE: Sed Rate: 2 mm/h (ref 0–30)

## 2022-05-26 LAB — ANA: Anti Nuclear Antibody (ANA): POSITIVE — AB

## 2022-05-26 MED ORDER — VITAMIN D (ERGOCALCIFEROL) 1.25 MG (50000 UNIT) PO CAPS: 50000.0000 [IU] | ORAL_CAPSULE | ORAL | 0 refills | Status: DC

## 2022-05-26 NOTE — Progress Notes (Signed)
Work on [employer] although borderline elevated , barely otherwise looks good Vitamin d low, sending in RX dosing, once complete start vitamin D3 2000 IU once daily  Thyroid unremarkable B12 ok

## 2022-05-27 ENCOUNTER — Other Ambulatory Visit: Payer: Self-pay | Admitting: Family

## 2022-05-27 DIAGNOSIS — R768 Other specified abnormal immunological findings in serum: Secondary | ICD-10-CM

## 2022-05-27 DIAGNOSIS — M2550 Pain in unspecified joint: Secondary | ICD-10-CM

## 2022-05-27 NOTE — Progress Notes (Signed)
Positive ANA  Will refer to rheumatologist for further workup

## 2022-06-11 ENCOUNTER — Encounter: Payer: Self-pay | Admitting: Family

## 2022-06-11 ENCOUNTER — Ambulatory Visit (INDEPENDENT_AMBULATORY_CARE_PROVIDER_SITE_OTHER): Payer: Managed Care, Other (non HMO) | Admitting: Family

## 2022-06-11 VITALS — BP 130/70 | HR 72 | Temp 98.6°F | Resp 16 | Ht 68.0 in | Wt 191.1 lb

## 2022-06-11 DIAGNOSIS — I10 Essential (primary) hypertension: Secondary | ICD-10-CM | POA: Diagnosis not present

## 2022-06-11 DIAGNOSIS — L409 Psoriasis, unspecified: Secondary | ICD-10-CM

## 2022-06-11 DIAGNOSIS — E041 Nontoxic single thyroid nodule: Secondary | ICD-10-CM | POA: Diagnosis not present

## 2022-06-11 DIAGNOSIS — E559 Vitamin D deficiency, unspecified: Secondary | ICD-10-CM | POA: Diagnosis not present

## 2022-06-11 MED ORDER — CLOBETASOL PROPIONATE 0.05 % EX SOLN: 1.0000 | Freq: Two times a day (BID) | CUTANEOUS | 0 refills | Status: DC

## 2022-06-11 MED ORDER — VITAMIN D (ERGOCALCIFEROL) 1.25 MG (50000 UNIT) PO CAPS: 50000.0000 [IU] | ORAL_CAPSULE | ORAL | 0 refills | Status: AC

## 2022-06-11 MED ORDER — LOSARTAN POTASSIUM 25 MG PO TABS: 25.0000 mg | ORAL_TABLET | Freq: Every day | ORAL | 1 refills | Status: DC

## 2022-06-11 NOTE — Assessment & Plan Note (Signed)
Start low dosage of losartan 25 mg once daily.  Pt advised of the following:  Continue medication as prescribed. Monitor blood pressure periodically and/or when you feel symptomatic. Goal is <130/90 on average. Ensure that you have rested for 30 minutes prior to checking your blood pressure. Record your readings and bring them to your next visit if necessary.work on a low sodium diet.

## 2022-06-11 NOTE — Assessment & Plan Note (Signed)
Clobetasol solution  nizoral once a week Head and shoulders shampoo

## 2022-06-11 NOTE — Assessment & Plan Note (Signed)
Start rx vitamin d Then otc 2000 IU once daily

## 2022-06-11 NOTE — Assessment & Plan Note (Signed)
Repeat u/s 04/2023

## 2022-06-11 NOTE — Patient Instructions (Addendum)
Start losartan 25 mg once daily.   Start RX vitamin D.  Once complete start over the counter vitamin D3 2000 IU once daily.   Sent in clobetasol solution to pharmacy.  Nizoral over the counter once weekly may help  And head and shoulders.   Due to recent changes in healthcare laws, you may see results of your imaging and/or laboratory studies on MyChart before I have had a chance to review them.  I understand that in some cases there may be results that are confusing or concerning to you. Please understand that not all results are received at the same time and often I may need to interpret multiple results in order to provide you with the best plan of care or course of treatment. Therefore, I ask that you please give me 2 business days to thoroughly review all your results before contacting my office for clarification. Should we see a critical lab result, you will be contacted sooner.   It was a pleasure seeing you today! Please do not hesitate to reach out with any questions and or concerns.  Regards,   Mort Sawyers FNP-C

## 2022-06-11 NOTE — Progress Notes (Signed)
Established Patient Office Visit  Subjective:  Patient ID: Jacqueline Torres, female    DOB: 07/26/1966  Age: 56 y.o. MRN: [id number]  CC:  Chief Complaint  Patient presents with  . Hypertension    HPI Jacqueline Torres is here today for follow up.   ANA positive, 1:320 , sister does have lupus. Has been referred to rheumatology, Dr. Dimple Casey. Pending a call back from their office to get an apt.  Does have itching on the posterior scalp.  Not sure if any flaking. Has tried some shampoos without much relief.  Has tried t gel without much relief. No rashes in other areas.  Polyarthralgia, stiffness in the am.  Vitamin d def: needs   Thyroid ultrasound: mildly heterogenous Right mid nodule 2.3 cm  Nodule meets criteria for surveillance (needs annual u/s) Already has u/s scheduled for next year for thyroid.   Elevated blood pressure: highest 143/98 lowest 112/86. Average seems to be around 130/86 No chest pain, no palpitations, no sob. Does feel hot often.   Past Medical History:  Diagnosis Date  . Colon polyps   . Menorrhagia     Past Surgical History:  Procedure Laterality Date  . ABLATION  2005   uterine ablation  . BLADDER SURGERY  2003   bladder tac  . COLONOSCOPY WITH PROPOFOL N/A 03/12/2020   repeat after 5 yrs; Procedure: COLONOSCOPY WITH PROPOFOL;  Surgeon: Wyline Mood, MD;  Location: [facility] ENDOSCOPY;  Service: Gastroenterology;  Laterality: N/A;  . TONSILLECTOMY     pt was 56 yrs old    Family History  Problem Relation Age of Onset  . Healthy Mother   . Cancer Father        leomyosarcoma  . Crohn's disease Daughter   . Colon cancer Maternal Aunt 50  . Breast cancer Neg Hx   . Ovarian cancer Neg Hx     Social History   Socioeconomic History  . Marital status: Divorced    Spouse name: Not on file  . Number of children: 4  . Years of education: Not on file  . 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 Smoking status: Never  . Smokeless tobacco: Never  Vaping Use  . Vaping Use: Never used  Substance and Sexual Activity  . Alcohol use: Never  . Drug use: Never  . Sexual activity: Yes    Partners: Male    Birth control/protection: Post-menopausal  Other Topics Concern  . Not on file  Social History Narrative  . Not on file   Social Determinants of Health   Financial Resource Strain: Not on file  Food Insecurity: Not on file  Transportation Needs: Not on file  Physical Activity: Not on file  Stress: Not on file  Social Connections: Not on file  Intimate Partner Violence: Not on file    Outpatient Medications Prior to Visit  Medication Sig Dispense Refill  . Vitamin D, Ergocalciferol, (DRISDOL) 1.25 MG (50000 UNIT) CAPS capsule Take 1 capsule (50,000 Units total) by mouth every 7 (seven) days for 8 doses. 8 capsule 0   No facility-administered medications prior to visit.    Allergies  Allergen Reactions  . Sulfa Antibiotics     Rash, pt feels like she is hungover.          Objective:    Physical Exam Constitutional:  General: She is not in acute distress.    Appearance: Normal appearance. She is not ill-appearing, toxic-appearing or diaphoretic.  Neck:     Thyroid: No thyroid mass, thyromegaly or thyroid tenderness.  Cardiovascular:     Rate and Rhythm: Normal rate and regular rhythm.  Pulmonary:     Effort: Pulmonary effort is normal.  Skin:    Findings: Rash (raised red plaques small and scattered posterior base of scalp) present.  Neurological:     Mental Status: She is alert.      BP 130/70   Pulse 72   Temp 98.6 F (37 C)   Resp 16   Ht 5\' 8"  (1.727 m)   Wt 191 lb 2 oz (86.7 kg)   SpO2 97%   BMI 29.06 kg/m  Wt Readings from Last 3 Encounters:  06/11/22 191 lb 2 oz (86.7 kg)  05/23/22 191 lb 6 oz (86.8 kg)  03/27/22 195 lb (88.5 kg)     Health Maintenance Due  Topic Date  Due  . HIV Screening  Never done  . Hepatitis C Screening  Never done  . TETANUS/TDAP  Never done  . Zoster Vaccines- Shingrix (1 of 2) Never done  . COVID-19 Vaccine (3 - Pfizer series) 04/04/2020  . MAMMOGRAM  01/03/2021  . PAP SMEAR-Modifier  11/16/2021  . INFLUENZA VACCINE  05/27/2022    There are no preventive care reminders to display for this patient.  Lab Results  Component Value Date   TSH 1.30 05/23/2022   Lab Results  Component Value Date   WBC 5.4 05/23/2022   HGB 14.4 05/23/2022   HCT 41.9 05/23/2022   MCV 85.2 05/23/2022   PLT 245 05/23/2022   Lab Results  Component Value Date   NA 142 05/23/2022   K 4.0 05/23/2022   CO2 29 05/23/2022   GLUCOSE 112 (H) 05/23/2022   BUN 16 05/23/2022   CREATININE 0.89 05/23/2022   BILITOT 0.3 05/23/2022   ALKPHOS 62 06/22/2019   AST 20 05/23/2022   ALT 29 05/23/2022   PROT 6.8 05/23/2022   ALBUMIN 4.7 06/22/2019   CALCIUM 9.5 05/23/2022   ANIONGAP 5 03/27/2022   Lab Results  Component Value Date   CHOL 184 05/23/2022   Lab Results  Component Value Date   HDL 69 05/23/2022   Lab Results  Component Value Date   LDLCALC 90 05/23/2022   Lab Results  Component Value Date   TRIG 150 (H) 05/23/2022   Lab Results  Component Value Date   CHOLHDL 2.7 05/23/2022   Lab Results  Component Value Date   HGBA1C 5.5 05/23/2022      Assessment & Plan:   Problem List Items Addressed This Visit       Cardiovascular and Mediastinum   Primary hypertension - Primary    Start low dosage of losartan 25 mg once daily.  Pt advised of the following:  Continue medication as prescribed. Monitor blood pressure periodically and/or when you feel symptomatic. Goal is <130/90 on average. Ensure that you have rested for 30 minutes prior to checking your blood pressure. Record your readings and bring them to your next visit if necessary.work on a low sodium diet.       Relevant Medications   losartan (COZAAR) 25 MG tablet      Endocrine   Right thyroid nodule    Repeat u/s 04/2023        Musculoskeletal and Integument   Psoriasis of scalp    Clobetasol  solution  nizoral once a week Head and shoulders shampoo      Relevant Medications   clobetasol (TEMOVATE) 0.05 % external solution     Other   Vitamin D deficiency    Start rx vitamin d Then otc 2000 IU once daily       Relevant Medications   Vitamin D, Ergocalciferol, (DRISDOL) 1.25 MG (50000 UNIT) CAPS capsule    Meds ordered this encounter  Medications  . Vitamin D, Ergocalciferol, (DRISDOL) 1.25 MG (50000 UNIT) CAPS capsule    Sig: Take 1 capsule (50,000 Units total) by mouth every 7 (seven) days for 8 doses.    Dispense:  8 capsule    Refill:  0    Order Specific Question:   Supervising Provider    Answer:   BEDSOLE, AMY E [[id number]]  . losartan (COZAAR) 25 MG tablet    Sig: Take 1 tablet (25 mg total) by mouth daily.    Dispense:  90 tablet    Refill:  1    Order Specific Question:   Supervising Provider    Answer:   BEDSOLE, AMY E [[id number]]  . clobetasol (TEMOVATE) 0.05 % external solution    Sig: Apply 1 Application topically 2 (two) times daily.    Dispense:  50 mL    Refill:  0    Order Specific Question:   Supervising Provider    Answer:   BEDSOLE, AMY E [[id number]]    Follow-up: Return in about 3 months (around 09/11/2022) for follow up on blood pressure .    Mort Sawyers, FNP

## 2022-07-29 ENCOUNTER — Ambulatory Visit
Admission: RE | Admit: 2022-07-29 | Discharge: 2022-07-29 | Disposition: A | Discharge status: Home / Self Care | Payer: 59 | Source: Ambulatory Visit | Attending: Family | Admitting: Family

## 2022-07-29 DIAGNOSIS — Z1231 Encounter for screening mammogram for malignant neoplasm of breast: Secondary | ICD-10-CM | POA: Diagnosis present

## 2022-07-30 ENCOUNTER — Other Ambulatory Visit: Payer: Self-pay | Admitting: Family

## 2022-07-30 ENCOUNTER — Other Ambulatory Visit: Payer: Self-pay | Admitting: Obstetrics and Gynecology

## 2022-07-30 DIAGNOSIS — R928 Other abnormal and inconclusive findings on diagnostic imaging of breast: Secondary | ICD-10-CM

## 2022-07-30 DIAGNOSIS — N63 Unspecified lump in unspecified breast: Secondary | ICD-10-CM

## 2022-08-14 ENCOUNTER — Ambulatory Visit
Admission: RE | Admit: 2022-08-14 | Discharge: 2022-08-14 | Disposition: A | Discharge status: Home / Self Care | Payer: 59 | Source: Ambulatory Visit | Attending: Family | Admitting: Family

## 2022-08-14 DIAGNOSIS — N63 Unspecified lump in unspecified breast: Secondary | ICD-10-CM | POA: Diagnosis present

## 2022-08-14 DIAGNOSIS — R928 Other abnormal and inconclusive findings on diagnostic imaging of breast: Secondary | ICD-10-CM | POA: Insufficient documentation

## 2022-08-18 NOTE — Progress Notes (Signed)
Noted.  Duplicate. 

## 2022-08-26 ENCOUNTER — Ambulatory Visit: Payer: 59 | Attending: Internal Medicine | Admitting: Internal Medicine

## 2022-08-26 ENCOUNTER — Encounter: Payer: Self-pay | Admitting: Internal Medicine

## 2022-08-26 VITALS — BP 128/84 | HR 67 | Resp 15 | Ht 68.0 in | Wt 196.4 lb

## 2022-08-26 DIAGNOSIS — H04123 Dry eye syndrome of bilateral lacrimal glands: Secondary | ICD-10-CM

## 2022-08-26 DIAGNOSIS — R768 Other specified abnormal immunological findings in serum: Secondary | ICD-10-CM

## 2022-08-26 DIAGNOSIS — M2550 Pain in unspecified joint: Secondary | ICD-10-CM | POA: Diagnosis not present

## 2022-08-26 DIAGNOSIS — R682 Dry mouth, unspecified: Secondary | ICD-10-CM | POA: Diagnosis not present

## 2022-08-26 DIAGNOSIS — L409 Psoriasis, unspecified: Secondary | ICD-10-CM

## 2022-08-26 NOTE — Progress Notes (Signed)
Office Visit Note  Patient: Jacqueline Torres             Date of Birth: 09/08/1966           MRN: [id number]             PCP: Eugenia Pancoast, FNP Referring: Eugenia Pancoast, FNP Visit Date: 08/26/2022  Subjective:  New Patient (Initial Visit) (Fatigue and ankle joint pain, neck and shoulder pain, hip and knee pain. )   History of Present Illness: Corianne Buccellato is a 56 y.o. female here for evaluation of positive ANA with multiple joint pains. Other findings also including thyroid nodule and dry eyes and mouth symptoms.  She has been noticing symptoms for at least about 5 years duration present at some level continuously.  Originally had plantar fasciitis received an injection for this which was very helpful.  About 1-1/2 years after the initial injection and treatment she redeveloped similar pain from the plantar fasciitis and trial of repeat injections was no longer beneficial.  Since then she has had increased trouble involving the ankle and front of the foot as well as on the plantar area.  Also experiences mild hip pain but much less severe than in her foot and ankle.  The symptoms have limit her ability to walk for prolonged periods such as when walking her dogs and with certain exercises.  Joint pains have been more severe in the past 1 year involving her neck and shoulder as well.  She has felt more fatigued than usual.  Also having difficulty finding a comfortable arm position at nighttime which is frequently disturbing her from sleep. 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  This is usually not symptomatic or mildly itchy and just treated with topical clobetasol as needed. She has persistent dry eyes and mouth frequently with eye redness.  She has seen her ophthalmologist with a prescription of Restasis but did not tolerate the medication and is just treating with artificial tears.  She focuses on hydration for dry mouth does sometimes use gum or lozenges.  She does not typically see skin  rashes on her torso and extremities and no photosensitive rashes.  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 She broke her left arm as a child volar bleeding no other major joint injuries or joint surgeries.  She is postmenopausal by a few years.  She does not recall any other specific medical events proceeding symptoms worsening.  Labs reviewed 04/2022 ANA 1:320 speckled CCP neg ESR 2 CRP 2.7 Vit D 26   Activities of Daily Living:  Patient reports morning stiffness for 5-10 minutes.   Patient Reports nocturnal pain.  Difficulty dressing/grooming: Denies Difficulty climbing stairs: Reports Difficulty getting out of chair: Denies Difficulty using hands for taps, buttons, cutlery, and/or writing: Denies  Review of Systems  Constitutional:  Positive for fatigue.  HENT:  Negative for mouth sores and mouth dryness.   Eyes:  Positive for dryness.  Respiratory:  Negative for shortness of breath.   Cardiovascular:  Negative for chest pain and palpitations.  Gastrointestinal:  Positive for constipation. Negative for blood in stool and diarrhea.  Endocrine: Negative for increased urination.  Musculoskeletal:  Positive for joint pain, gait problem, joint pain, joint swelling, myalgias, muscle weakness, morning stiffness, muscle tenderness and myalgias.  Skin:  Positive for hair loss. Negative for color change, rash and sensitivity to sunlight.  Allergic/Immunologic: Negative for susceptible to  infections.  Neurological:  Positive for headaches. Negative for dizziness.  Hematological:  Negative for swollen glands.  Psychiatric/Behavioral:  Positive for sleep disturbance. Negative for depressed mood. The patient is not nervous/anxious.     PMFS History:  Patient Active Problem List   Diagnosis Date Noted   Positive ANA (antinuclear antibody) 10/01/2022   Primary hypertension 06/11/2022   Psoriasis of  scalp 06/11/2022   Vitamin D deficiency 05/23/2022   Vertigo 05/23/2022   Right thyroid nodule 05/23/2022   Dry mouth 05/23/2022   Dry eyes 05/23/2022   Polyarthralgia 05/23/2022    Past Medical History:  Diagnosis Date   Colon polyps    Menorrhagia     Family History  Problem Relation Age of Onset   Healthy Mother    Cancer Father        leomyosarcoma   Hypertension Father    Healthy Brother    Healthy Brother    Colon cancer Maternal Aunt 28   Crohn's disease Daughter    Lupus Sister    Healthy Son    Healthy Son    Healthy Son    Breast cancer Neg Hx    Ovarian cancer Neg Hx    Past Surgical History:  Procedure Laterality Date   ABLATION  2005   uterine ablation   BLADDER SURGERY  2003   bladder tac   COLONOSCOPY WITH PROPOFOL N/A 03/12/2020   repeat after 5 yrs; Procedure: COLONOSCOPY WITH PROPOFOL;  Surgeon: Jonathon Bellows, MD;  Location: [facility] ENDOSCOPY;  Service: Gastroenterology;  Laterality: N/A;   TONSILLECTOMY     pt was 56 yrs old   Social History   Social History Narrative   Not on file   Immunization History  Administered Date(s) Administered   PFIZER(Purple Top)SARS-COV-2 Vaccination 01/13/2020, 02/08/2020     Objective: Vital Signs: BP 128/84 (BP Location: Left Arm, Patient Position: Sitting, Cuff Size: Normal)   Pulse 67   Resp 15   Ht _0  (1.727 m)   Wt 196 lb 6.4 oz (89.1 kg)   BMI 29.86 kg/m    Physical Exam HENT:     Mouth/Throat:     Mouth: Mucous membranes are dry.     Pharynx: Oropharynx is clear.  Eyes:     Comments: Mild conjunctival redness b/l  Cardiovascular:     Rate and Rhythm: Normal rate and regular rhythm.  Pulmonary:     Effort: Pulmonary effort is normal.     Breath sounds: Normal breath sounds.  Musculoskeletal:     Right lower leg: No edema.     Left lower leg: No edema.  Lymphadenopathy:     Cervical: No cervical adenopathy.  Skin:    General: Skin is warm and dry.     Findings: Rash present.      Comments: Tiny patch of erythematous rash on back of scalp, no overlying alopecia  Neurological:     Mental Status: She is alert.      Musculoskeletal Exam:  Neck full ROM no tenderness Shoulders full ROM no tenderness or swelling Elbows full ROM no tenderness or swelling Wrists full ROM no tenderness or swelling Fingers full ROM no tenderness or swelling Paraspinal muscle tenderness to pressure above and between scapulae, over lumbar spine Knees full ROM no tenderness or swelling Ankles full ROM no tenderness or swelling MTPs full ROM no tenderness or swelling, left 1st MTP with dorsal bony nodule and slightly cocked up position   Investigation: No additional findings.  Imaging: No  results found.  Recent Labs: Lab Results  Component Value Date   WBC 5.4 05/23/2022   HGB 14.4 05/23/2022   PLT 245 05/23/2022   NA 142 05/23/2022   K 4.0 05/23/2022   CL 104 05/23/2022   CO2 29 05/23/2022   GLUCOSE 112 (H) 05/23/2022   BUN 16 05/23/2022   CREATININE 0.89 05/23/2022   BILITOT 0.3 05/23/2022   ALKPHOS 62 06/22/2019   AST 20 05/23/2022   ALT 29 05/23/2022   PROT 6.8 05/23/2022   ALBUMIN 4.7 06/22/2019   CALCIUM 9.5 05/23/2022   GFRAA 81 06/22/2019    Speciality Comments: No specialty comments available.  Procedures:  No procedures performed Allergies: Sulfa antibiotics   Assessment / Plan:     Visit Diagnoses: Positive ANA (antinuclear antibody) - Plan: Sedimentation rate, C-reactive protein, Rheumatoid factor, Anti-scleroderma antibody, RNP Antibody, Anti-Smith antibody, Sjogrens syndrome-A extractable nuclear antibody, Anti-DNA antibody, double-stranded, C3 and C4, Sjogrens syndrome-B extractable nuclear antibody  Chronic dry eyes and mouth and multiple arthralgias symptoms may be consistent with primary Sjogren's syndrome.  No ulcers or lesions no obvious salivary gland swelling or lymphadenopathy.  Will check specific antibody panel as detailed above including  rheumatoid factor serum complements and inflammatory markers.  If these are abnormal we recommend trial of hydroxychloroquine particularly for the arthralgias not very likely to significantly improve dry mouth symptoms.  With negative workup could discuss empiric trial of treatment versus observation depending on symptom severity.  Polyarthralgia  Joint pain in multiple areas originally having the most trouble with her feet and ankles specially plantar fasciitis.  Enthesopathy and tendinitis could be consistent with peripheral involvement of psoriatic arthritis but no definite inflammation on exam and minimally active skin disease which is longstanding so seems less likely at this time.  Dry eyes Dry mouth  Continuing supportive treatment with hydration and artificial tear eyedrops.  No major complications so far she has regular follow-up planned with ophthalmology.  Psoriasis of scalp  Treating with topical clobetasol as needed no significant activity besides scalp on exam today.  Orders: Orders Placed This Encounter  Procedures   Sedimentation rate   C-reactive protein   Rheumatoid factor   Anti-scleroderma antibody   RNP Antibody   Anti-Smith antibody   Sjogrens syndrome-A extractable nuclear antibody   Anti-DNA antibody, double-stranded   C3 and C4   Sjogrens syndrome-B extractable nuclear antibody   No orders of the defined types were placed in this encounter.    Follow-Up Instructions: Return in about 4 weeks (around 09/23/2022) for +ANA/arthralgia/eyes f/u 4wks.   Collier Salina, MD  Note - This record has been created using [employer].  Chart creation errors have been sought, but may not always  have been located. Such creation errors do not reflect on  the standard of medical care.

## 2022-08-27 LAB — ANTI-SCLERODERMA ANTIBODY: Scleroderma (Scl-70) (ENA) Antibody, IgG: <1 AI

## 2022-08-27 LAB — ANTI-DNA ANTIBODY, DOUBLE-STRANDED: ds DNA Ab: <1 IU/mL

## 2022-08-27 LAB — C3 AND C4
C3 Complement: 145 mg/dL (ref 83–193)
C4 Complement: 21 mg/dL (ref 15–57)

## 2022-08-27 LAB — ANTI-SMITH ANTIBODY: ENA SM Ab Ser-aCnc: <1 AI

## 2022-08-27 LAB — RHEUMATOID FACTOR: Rheumatoid fact SerPl-aCnc: <14 IU/mL (ref <14)

## 2022-08-27 LAB — C-REACTIVE PROTEIN: CRP: 3 mg/L (ref <8.0)

## 2022-08-27 LAB — SJOGRENS SYNDROME-A EXTRACTABLE NUCLEAR ANTIBODY: SSA (Ro) (ENA) Antibody, IgG: <1 AI

## 2022-08-27 LAB — RNP ANTIBODY: Ribonucleic Protein(ENA) Antibody, IgG: <1 AI

## 2022-08-27 LAB — SEDIMENTATION RATE: Sed Rate: 2 mm/h (ref 0–30)

## 2022-08-27 LAB — SJOGRENS SYNDROME-B EXTRACTABLE NUCLEAR ANTIBODY: SSB (La) (ENA) Antibody, IgG: <1 AI

## 2022-09-30 NOTE — Progress Notes (Unsigned)
Office Visit Note  Patient: Jacqueline Torres             Date of Birth: [birth year]           MRN: [id number]             PCP: Mort Sawyers, FNP Referring: Mort Sawyers, FNP Visit Date: 10/01/2022   Subjective:  No chief complaint on file.   History of Present Illness: Jacqueline Torres is a 56 y.o. female here for follow up for evaluation of multiple joint pains and fatigue and chronic dry eyes and mouth symptoms with positive ANA. Lab testing at initial visit was negative for evidence of autoimmune disease antibodies and systemic inflammation.***   Previous HPI    No Rheumatology ROS completed.   PMFS History:  Patient Active Problem List   Diagnosis Date Noted   Primary hypertension 06/11/2022   Psoriasis of scalp 06/11/2022   Vitamin D deficiency 05/23/2022   Vertigo 05/23/2022   Right thyroid nodule 05/23/2022   Dry mouth 05/23/2022   Dry eyes 05/23/2022   Polyarthralgia 05/23/2022    Past Medical History:  Diagnosis Date   Colon polyps    Menorrhagia     Family History  Problem Relation Age of Onset   Healthy Mother    Cancer Father        leomyosarcoma   Hypertension Father    Healthy Brother    Healthy Brother    Colon cancer Maternal Aunt 24   Crohn's disease Daughter    Lupus Sister    Healthy Torres    Healthy Torres    Healthy Torres    Breast cancer Neg Hx    Ovarian cancer Neg Hx    Past Surgical History:  Procedure Laterality Date   ABLATION  2005   uterine ablation   BLADDER SURGERY  2003   bladder tac   COLONOSCOPY WITH PROPOFOL N/A 03/12/2020   repeat after 5 yrs; Procedure: COLONOSCOPY WITH PROPOFOL;  Surgeon: Wyline Mood, MD;  Location[facility] ENDOSCOPY;  Service: Gastroenterology;  Laterality: N/A;   TONSILLECTOMY     pt was 56 yrs old   Social History   Social History Narrative   Not on file   Immunization History  Administered Date(s) Administered   PFIZER(Purple Top)SARS-COV-2 Vaccination 01/13/2020, 02/08/2020      Objective: Vital Signs: There were no vitals taken for this visit.   Physical Exam   Musculoskeletal Exam: ***  CDAI Exam: CDAI Score: -- Patient Global: --; Provider Global: -- Swollen: --; Tender: -- Joint Exam 10/01/2022   No joint exam has been documented for this visit   There is currently no information documented on the homunculus. Go to the Rheumatology activity and complete the homunculus joint exam.  Investigation: No additional findings.  Imaging: No results found.  Recent Labs: Lab Results  Component Value Date   WBC 5.4 05/23/2022   HGB 14.4 05/23/2022   PLT 245 05/23/2022   NA 142 05/23/2022   K 4.0 05/23/2022   CL 104 05/23/2022   CO2 29 05/23/2022   GLUCOSE 112 (H) 05/23/2022   BUN 16 05/23/2022   CREATININE 0.89 05/23/2022   BILITOT 0.3 05/23/2022   ALKPHOS 62 06/22/2019   AST 20 05/23/2022   ALT 29 05/23/2022   PROT 6.8 05/23/2022   ALBUMIN 4.7 06/22/2019   CALCIUM 9.5 05/23/2022   GFRAA 81 06/22/2019    Speciality Comments: No specialty comments available.  Procedures:  No procedures performed Allergies: Sulfa antibiotics  Assessment / Plan:     Visit Diagnoses: No diagnosis found.  ***  Orders: No orders of the defined types were placed in this encounter.  No orders of the defined types were placed in this encounter.    Follow-Up Instructions: No follow-ups on file.   Collier Salina, MD  Note - This record has been created using [employer].  Chart creation errors have been sought, but may not always  have been located. Such creation errors do not reflect on  the standard of medical care.

## 2022-10-01 ENCOUNTER — Encounter: Payer: Self-pay | Admitting: Internal Medicine

## 2022-10-01 ENCOUNTER — Ambulatory Visit: Payer: 59 | Attending: Internal Medicine | Admitting: Internal Medicine

## 2022-10-01 VITALS — BP 123/76 | HR 66 | Resp 15 | Ht 68.0 in | Wt 194.0 lb

## 2022-10-01 DIAGNOSIS — M2550 Pain in unspecified joint: Secondary | ICD-10-CM

## 2022-10-01 DIAGNOSIS — L409 Psoriasis, unspecified: Secondary | ICD-10-CM | POA: Diagnosis not present

## 2022-10-01 DIAGNOSIS — R682 Dry mouth, unspecified: Secondary | ICD-10-CM

## 2022-10-01 DIAGNOSIS — R768 Other specified abnormal immunological findings in serum: Secondary | ICD-10-CM

## 2022-10-01 NOTE — Patient Instructions (Signed)
Continue to treat dry eyes and mouth supportively and stay well hydrated. I don't see any red flags for inflammation elsewhere throughout the body.  Let us know if skin rashes increase or do not improve with current topical medication within the next few weeks, I would recommend we prescribe a topical steroid ointment in that case.

## 2022-10-27 ENCOUNTER — Other Ambulatory Visit: Payer: Self-pay | Admitting: Family

## 2022-10-27 DIAGNOSIS — L409 Psoriasis, unspecified: Secondary | ICD-10-CM

## 2023-04-01 NOTE — Progress Notes (Signed)
Office Visit Note  Patient: Jacqueline Torres             Date of Birth: October 31, 1965           MRN: [id number]             PCP: Mort Sawyers, FNP Referring: Mort Sawyers, FNP Visit Date: 04/02/2023   Subjective:  Follow-up   History of Present Illness: Jacqueline Torres is a 57 y.o. female here for follow up with chronic joint pain in multiple areas questionable inflammatory disease with rashes, mouth sores, chronic eye dryness.  Since her last visit rashes are overall doing pretty well.  Still has mild irritation on the scalp but none active elsewhere on the body.  She gets canker sores along the inside front of the mouth every few weeks but is clear of these completely more of the time than not.  For eye dryness uses refresh drops on some days but not on others and no significant vision change or acute inflammation.  Still having joint pains worse at the left shoulder and the hip currently shoulder is her biggest complaint.  She did not follow through with trying many of the recommended range of motion or strengthening exercises since last visit.  Previous HPI 10/01/22 Jacqueline Torres is a 57 y.o. female here for follow up for evaluation of multiple joint pains and fatigue and chronic dry eyes and mouth symptoms with positive ANA. Lab testing at initial visit was negative for evidence of autoimmune disease antibodies and systemic inflammation.  Since our initial visit symptoms remain mostly about the same regarding dryness fatigue and joint pains.   Previous HPI 08/26/22 Jacqueline Torres is a 57 y.o. female here for evaluation of positive ANA with multiple joint pains. Other findings also including thyroid nodule and dry eyes and mouth symptoms.  She has been noticing symptoms for at least about 5 years duration present at some level continuously.  Originally had plantar fasciitis received an injection for this which was very helpful.  About 1-1/2 years after the initial injection and treatment  she redeveloped similar pain from the plantar fasciitis and trial of repeat injections was no longer beneficial.  Since then she has had increased trouble involving the ankle and front of the foot as well as on the plantar area.  Also experiences mild hip pain but much less severe than in her foot and ankle.  The symptoms have limit her ability to walk for prolonged periods such as when walking her dogs and with certain exercises.  Joint pains have been more severe in the past 1 year involving her neck and shoulder as well.  She has felt more fatigued than usual.  Also having difficulty finding a comfortable arm position at nighttime which is frequently disturbing her from sleep. 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  This is usually not symptomatic or mildly itchy and just treated with topical clobetasol as needed. She has persistent dry eyes and mouth frequently with eye redness.  She has seen her ophthalmologist with a prescription of Restasis but did not tolerate the medication and is just treating with artificial tears.  She focuses on hydration for dry mouth does sometimes use gum or lozenges.  She does not typically see skin rashes on her torso and extremities and no photosensitive rashes.  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 She broke her left arm as a child volar  bleeding no other major joint injuries or joint surgeries.  She is postmenopausal by a few years.  She does not recall any other specific medical events proceeding symptoms worsening.   Review of Systems  Constitutional:  Positive for fatigue.  HENT:  Positive for mouth sores and mouth dryness.   Eyes:  Positive for dryness.  Respiratory:  Negative for shortness of breath.   Cardiovascular:  Negative for chest pain and palpitations.  Gastrointestinal:  Negative for blood in stool, constipation and diarrhea.  Endocrine: Negative for  increased urination.  Genitourinary:  Negative for involuntary urination.  Musculoskeletal:  Positive for joint pain, gait problem, joint pain, myalgias, muscle weakness, morning stiffness, muscle tenderness and myalgias. Negative for joint swelling.  Skin:  Positive for rash and hair loss. Negative for color change and sensitivity to sunlight.  Allergic/Immunologic: Negative for susceptible to infections.  Neurological:  Positive for dizziness and headaches.  Hematological:  Negative for swollen glands.  Psychiatric/Behavioral:  Positive for sleep disturbance. Negative for depressed mood. The patient is not nervous/anxious.     PMFS History:  Patient Active Problem List   Diagnosis Date Noted   Pain in left shoulder 04/02/2023   Pain in left hip 04/02/2023   Positive ANA (antinuclear antibody) 10/01/2022   Primary hypertension 06/11/2022   Psoriasis of scalp 06/11/2022   Vitamin D deficiency 05/23/2022   Vertigo 05/23/2022   Right thyroid nodule 05/23/2022   Dry mouth 05/23/2022   Dry eyes 05/23/2022   Polyarthralgia 05/23/2022    Past Medical History:  Diagnosis Date   Colon polyps    Menorrhagia     Family History  Problem Relation Age of Onset   Healthy Mother    Cancer Father        leomyosarcoma   Hypertension Father    Healthy Brother    Healthy Brother    Colon cancer Maternal Aunt 52   Crohn's disease Daughter    Lupus Sister    Healthy Son    Healthy Son    Healthy Son    Breast cancer Neg Hx    Ovarian cancer Neg Hx    Past Surgical History:  Procedure Laterality Date   ABLATION  2005   uterine ablation   BLADDER SURGERY  2003   bladder tac   COLONOSCOPY WITH PROPOFOL N/A 03/12/2020   repeat after 5 yrs; Procedure: COLONOSCOPY WITH PROPOFOL;  Surgeon: Wyline Mood, MD;  Location: [facility] ENDOSCOPY;  Service: Gastroenterology;  Laterality: N/A;   TONSILLECTOMY     pt was 57 yrs old   Social History   Social History Narrative   Not on file    Immunization History  Administered Date(s) Administered   PFIZER(Purple Top)SARS-COV-2 Vaccination 01/13/2020, 02/08/2020     Objective: Vital Signs: BP 125/82 (BP Location: Left Arm, Patient Position: Sitting, Cuff Size: Normal)   Pulse 71   Resp 14   Ht 5\' 8"  (1.727 m)   Wt 191 lb (86.6 kg)   BMI 29.04 kg/m    Physical Exam HENT:     Mouth/Throat:     Mouth: Mucous membranes are moist.     Pharynx: Oropharynx is clear.  Eyes:     Conjunctiva/sclera: Conjunctivae normal.  Cardiovascular:     Rate and Rhythm: Normal rate and regular rhythm.  Pulmonary:     Effort: Pulmonary effort is normal.     Breath sounds: Normal breath sounds.  Skin:    General: Skin is warm and dry.  Findings: Rash (Small flat patch of erythema near the base of occiput with no overlying scaling) present.  Neurological:     Mental Status: She is alert.  Psychiatric:        Mood and Affect: Mood normal.      Musculoskeletal Exam:  Left shoulder tenderness to pressure laterally and pain with overhead abduction or resisted abduction Some tenderness along paraspinal muscles and pain with scapular range of motion worse on right side, no palpable nodules no radiation Elbows full ROM no tenderness or swelling Wrists full ROM no tenderness or swelling Fingers full ROM no tenderness or swelling Lateral hip tenderness to pressure, no anterior or groin pain with full rotation ROM Knees full ROM no tenderness or swelling   Investigation: No additional findings.  Imaging: No results found.  Recent Labs: Lab Results  Component Value Date   WBC 5.4 05/23/2022   HGB 14.4 05/23/2022   PLT 245 05/23/2022   NA 142 05/23/2022   K 4.0 05/23/2022   CL 104 05/23/2022   CO2 29 05/23/2022   GLUCOSE 112 (H) 05/23/2022   BUN 16 05/23/2022   CREATININE 0.89 05/23/2022   BILITOT 0.3 05/23/2022   ALKPHOS 62 06/22/2019   AST 20 05/23/2022   ALT 29 05/23/2022   PROT 6.8 05/23/2022   ALBUMIN 4.7  06/22/2019   CALCIUM 9.5 05/23/2022   GFRAA 81 06/22/2019    Speciality Comments: No specialty comments available.  Procedures:  No procedures performed Allergies: Sulfa antibiotics   Assessment / Plan:     Visit Diagnoses: Polyarthralgia Chronic left shoulder pain  Pain in left hip - Plan: Ambulatory referral to Physical Therapy  Hip and shoulder pain appear more consistent for bursitis than inflammatory joint pain.  No appreciable swelling and there is fair amount of extension of pain into the supporting musculature.  Has not been very successful with self-directed home exercises for this so far.  Discussed possible options including additional use of oral anti-inflammatory medications, local steroid injections, or physical therapy.  I believe physical therapy would be most appropriate for achieving a more durable benefit in the setting of some muscular and myofascial pain component.  Dry eyes Dry mouth  Ongoing dry eye and mouth symptoms without complications and pretty unremarkable on exam today.  Question if symptoms could be consistent with a primary Sjogren's syndrome though serology is nonspecific.  With lack of major inflammatory symptoms treatment would just be symptomatic for now anyway so would not pursue any biopsy.  Psoriasis of scalp  Scalp psoriasis very minimal inflammation on exam today.  Has the clobetasol 0.05% solution prescribed by PCP with pretty good benefit.  No evidence of psoriatic arthritis.  Orders: Orders Placed This Encounter  Procedures   Ambulatory referral to Physical Therapy   No orders of the defined types were placed in this encounter.    Follow-Up Instructions: Return in about 6 months (around 10/02/2023) for PsO/?SS f/u 6mos.   Fuller Plan, MD  Note - This record has been created using [employer].  Chart creation errors have been sought, but may not always  have been located. Such creation errors do not reflect on  the  standard of medical care.

## 2023-04-02 ENCOUNTER — Ambulatory Visit: Payer: 59 | Attending: Internal Medicine | Admitting: Internal Medicine

## 2023-04-02 ENCOUNTER — Encounter: Payer: Self-pay | Admitting: Internal Medicine

## 2023-04-02 ENCOUNTER — Other Ambulatory Visit: Payer: Self-pay | Admitting: *Deleted

## 2023-04-02 VITALS — BP 125/82 | HR 71 | Resp 14 | Ht 68.0 in | Wt 191.0 lb

## 2023-04-02 DIAGNOSIS — G8929 Other chronic pain: Secondary | ICD-10-CM

## 2023-04-02 DIAGNOSIS — H04123 Dry eye syndrome of bilateral lacrimal glands: Secondary | ICD-10-CM

## 2023-04-02 DIAGNOSIS — L409 Psoriasis, unspecified: Secondary | ICD-10-CM | POA: Diagnosis not present

## 2023-04-02 DIAGNOSIS — M2550 Pain in unspecified joint: Secondary | ICD-10-CM | POA: Diagnosis not present

## 2023-04-02 DIAGNOSIS — R682 Dry mouth, unspecified: Secondary | ICD-10-CM | POA: Diagnosis not present

## 2023-04-02 DIAGNOSIS — M25552 Pain in left hip: Secondary | ICD-10-CM | POA: Insufficient documentation

## 2023-04-02 DIAGNOSIS — M25512 Pain in left shoulder: Secondary | ICD-10-CM | POA: Insufficient documentation

## 2023-04-15 ENCOUNTER — Ambulatory Visit (HOSPITAL_COMMUNITY): Payer: 59 | Attending: Internal Medicine | Admitting: Occupational Therapy

## 2023-04-15 ENCOUNTER — Other Ambulatory Visit: Payer: Self-pay

## 2023-04-15 ENCOUNTER — Encounter (HOSPITAL_COMMUNITY): Payer: Self-pay | Admitting: Occupational Therapy

## 2023-04-15 DIAGNOSIS — M25512 Pain in left shoulder: Secondary | ICD-10-CM | POA: Insufficient documentation

## 2023-04-15 DIAGNOSIS — G8929 Other chronic pain: Secondary | ICD-10-CM | POA: Diagnosis present

## 2023-04-15 DIAGNOSIS — R29898 Other symptoms and signs involving the musculoskeletal system: Secondary | ICD-10-CM | POA: Insufficient documentation

## 2023-04-15 NOTE — Therapy (Signed)
OUTPATIENT OCCUPATIONAL THERAPY ORTHO EVALUATION  Patient Name: Jacqueline Torres MRN: [id number] DOB:1965-11-21, 57 y.o., female Today's Date: 04/15/2023   END OF SESSION:  OT End of Session - 04/15/23 1414     Visit Number 1    Number of Visits 6    Date for OT Re-Evaluation 05/27/23    Authorization Type [employer] Generic    Authorization Time Period 50 visit limit, $35 copay    Authorization - Visit Number 1    Authorization - Number of Visits 50    OT Start Time 1340    OT Stop Time 1414    OT Time Calculation (min) 34 min    Activity Tolerance Patient tolerated treatment well    Behavior During Therapy WFL for tasks assessed/performed             Past Medical History:  Diagnosis Date   Colon polyps    Menorrhagia    Past Surgical History:  Procedure Laterality Date   ABLATION  2005   uterine ablation   BLADDER SURGERY  2003   bladder tac   COLONOSCOPY WITH PROPOFOL N/A 03/12/2020   repeat after 5 yrs; Procedure: COLONOSCOPY WITH PROPOFOL;  Surgeon: Wyline Mood, MD;  Location: [facility] ENDOSCOPY;  Service: Gastroenterology;  Laterality: N/A;   TONSILLECTOMY     pt was 57 yrs old   Patient Active Problem List   Diagnosis Date Noted   Pain in left shoulder 04/02/2023   Pain in left hip 04/02/2023   Positive ANA (antinuclear antibody) 10/01/2022   Primary hypertension 06/11/2022   Psoriasis of scalp 06/11/2022   Vitamin D deficiency 05/23/2022   Vertigo 05/23/2022   Right thyroid nodule 05/23/2022   Dry mouth 05/23/2022   Dry eyes 05/23/2022   Polyarthralgia 05/23/2022    PCP: Mort Sawyers, FNP REFERRING PROVIDER: Dr. Sheliah Hatch  ONSET DATE: over a year  REFERRING DIAG: Chronic left shoulder pain  THERAPY DIAG:  Chronic left shoulder pain  Other symptoms and signs involving the musculoskeletal system  Rationale for Evaluation and Treatment: Rehabilitation  SUBJECTIVE:   SUBJECTIVE STATEMENT: S: It's been hurting for over a year.  Pt  accompanied by: self  PERTINENT HISTORY: Pt is a 57 y/o female presenting with chronic left shoulder pain for > 1 year, has a remote hx of LUE fractures at 57 year old. Pt does not recall any precipitating factors for this pain, has not had any injections or imaging, is pursuing conservative treatment first.   PRECAUTIONS: None  WEIGHT BEARING RESTRICTIONS: No  PAIN:  Are you having pain? Yes: NPRS scale: 2/10 Pain location: posterior left shoulder Pain description: aching Aggravating factors: increased movement Relieving factors: nothing  FALLS: Has patient fallen in last 6 months? No  PLOF: Independent  PATIENT GOALS: To have less pain in the left shoulder.   NEXT MD VISIT: 10/02/23  OBJECTIVE:   HAND DOMINANCE: Right  ADLs: Overall ADLs: Pt reports difficulty with reaching behind her, braiding her hair, reaching up into cabinets, pulling a garage door down. Pt reports difficulty sleeping, wakes up at least twice per night. Pt is unable to perform lifting tasks.   FUNCTIONAL OUTCOME MEASURES: FOTO: 59/100  UPPER EXTREMITY ROM:       Assessed in sitting, er/IR adducted  Active ROM Left eval  Shoulder flexion 154  Shoulder abduction 93  Shoulder internal rotation 90  Shoulder external rotation 75  (Blank rows = not tested)    UPPER EXTREMITY MMT:     Assessed in  sitting, er/IR adducted  MMT Left eval  Shoulder flexion 5/5  Shoulder abduction 4/5  Shoulder internal rotation 5/5  Shoulder external rotation 4+/5  (Blank rows = not tested)  HAND FUNCTION: Grip strength: Right: 60 lbs; Left: 55 lbs  SENSATION: Occasional numbness or tingling  COGNITION: Overall cognitive status: Within functional limits for tasks assessed  OBSERVATIONS: mod fascial restrictions in the LUE; hypermobility of joints   TODAY'S TREATMENT:                                                                                                                              DATE:  N/A-eval only    PATIENT EDUCATION: Education details: scapular theraband strengthening-red Person educated: Patient Education method: Explanation, Demonstration, and Handouts Education comprehension: verbalized understanding and returned demonstration  HOME EXERCISE PROGRAM: Eval: red scapular theraband strengthening  GOALS: Goals reviewed with patient? Yes   SHORT TERM GOALS: Target date: 05/06/23  Pt will be provided with and educated on HEP to improve mobility in LUE required for use during ADL completion.   Goal status: INITIAL    LONG TERM GOALS: Target date: 05/27/23  Pt will decrease pain in LUE to 3/10 or less to improve ability to sleep without waking more than 1 time nightly due to pain.   Goal status: INITIAL  2.  Pt will decrease LUE fascial restrictions to min amounts or less to improve mobility required for functional reaching tasks.   Goal status: INITIAL  3.  Pt will increase LUE A/ROM by 15 degrees to improve ability to use LUE when reaching overhead or behind back during dressing and bathing tasks.   Goal status: INITIAL  4.  Pt will increase LUE strength to 5/5 or greater to improve ability to use LUE when lifting or carrying items during meal preparation/housework/yardwork tasks.   Goal status: INITIAL  5.  Pt will return to highest level of function using LUE as non-dominant during functional task completion.   Goal status: INITIAL   ASSESSMENT:  CLINICAL IMPRESSION: Patient is a 57 y.o. female who was seen today for occupational therapy evaluation for chronic left shoulder pain. Pt presents with increased pain and fascial restrictions, decreased ROM, strength, and functional use of the LUE. Pt with hypermobility of joints, notes frequent popping with movement. Question labral instability issue. Provided scapular theraband HEP today. Discussed plan for skilled therapy services focusing on LUE strengthening and stability work and HEP between  sessions. Pt agreeable to plan.   PERFORMANCE DEFICITS: in functional skills including in functional skills including ADLs, IADLs, coordination, tone, ROM, strength, pain, fascial restrictions, muscle spasms, and UE functional use   IMPAIRMENTS: are limiting patient from ADLs, IADLs, rest and sleep, work, and leisure.   COMORBIDITIES: has no other co-morbidities that affects occupational performance. Patient will benefit from skilled OT to address above impairments and improve overall function.  MODIFICATION OR ASSISTANCE TO COMPLETE EVALUATION: No modification of tasks or assist necessary to  complete an evaluation.  OT OCCUPATIONAL PROFILE AND HISTORY: Problem focused assessment: Including review of records relating to presenting problem.  CLINICAL DECISION MAKING: LOW - limited treatment options, no task modification necessary  REHAB POTENTIAL: Good  EVALUATION COMPLEXITY: Low      PLAN:  OT FREQUENCY: 1x/week  OT DURATION: 6 weeks  PLANNED INTERVENTIONS: self care/ADL training, therapeutic exercise, therapeutic activity, neuromuscular re-education, manual therapy, passive range of motion, splinting, electrical stimulation, ultrasound, moist heat, cryotherapy, patient/family education, and DME and/or AE instructions  RECOMMENDED OTHER SERVICES: None at this time  CONSULTED AND AGREED WITH PLAN OF CARE: Patient  PLAN FOR NEXT SESSION: Follow up on HEP, initiate stability work-theraband, push up work, therapy ball work, update HEP   [employer], OTR/L  [phone] 04/15/2023, 2:15 PM

## 2023-04-15 NOTE — Patient Instructions (Signed)

## 2023-04-22 ENCOUNTER — Encounter (HOSPITAL_COMMUNITY): Payer: Self-pay | Admitting: Occupational Therapy

## 2023-04-22 ENCOUNTER — Ambulatory Visit (HOSPITAL_COMMUNITY): Payer: 59 | Admitting: Occupational Therapy

## 2023-04-22 DIAGNOSIS — R29898 Other symptoms and signs involving the musculoskeletal system: Secondary | ICD-10-CM

## 2023-04-22 DIAGNOSIS — G8929 Other chronic pain: Secondary | ICD-10-CM

## 2023-04-22 DIAGNOSIS — M25512 Pain in left shoulder: Secondary | ICD-10-CM | POA: Diagnosis not present

## 2023-04-22 NOTE — Patient Instructions (Signed)

## 2023-04-22 NOTE — Therapy (Signed)
OUTPATIENT OCCUPATIONAL THERAPY ORTHO TREATMENT  Patient Name: Jacqueline Torres MRN: [id number] DOB:May 30, 1966, 57 y.o., female Today's Date: 04/22/2023   END OF SESSION:  OT End of Session - 04/22/23 1402     Visit Number 2    Number of Visits 6    Date for OT Re-Evaluation 05/27/23    Authorization Type [employer] Generic    Authorization Time Period 50 visit limit, $35 copay    Authorization - Visit Number 2    Authorization - Number of Visits 50    OT Start Time 1346    OT Stop Time 1426    OT Time Calculation (min) 40 min    Activity Tolerance Patient tolerated treatment well    Behavior During Therapy WFL for tasks assessed/performed              Past Medical History:  Diagnosis Date   Colon polyps    Menorrhagia    Past Surgical History:  Procedure Laterality Date   ABLATION  2005   uterine ablation   BLADDER SURGERY  2003   bladder tac   COLONOSCOPY WITH PROPOFOL N/A 03/12/2020   repeat after 5 yrs; Procedure: COLONOSCOPY WITH PROPOFOL;  Surgeon: Wyline Mood, MD;  Location: [facility] ENDOSCOPY;  Service: Gastroenterology;  Laterality: N/A;   TONSILLECTOMY     pt was 57 yrs old   Patient Active Problem List   Diagnosis Date Noted   Pain in left shoulder 04/02/2023   Pain in left hip 04/02/2023   Positive ANA (antinuclear antibody) 10/01/2022   Primary hypertension 06/11/2022   Psoriasis of scalp 06/11/2022   Vitamin D deficiency 05/23/2022   Vertigo 05/23/2022   Right thyroid nodule 05/23/2022   Dry mouth 05/23/2022   Dry eyes 05/23/2022   Polyarthralgia 05/23/2022    PCP: Mort Sawyers, FNP REFERRING PROVIDER: Dr. Sheliah Hatch  ONSET DATE: over a year  REFERRING DIAG: Chronic left shoulder pain  THERAPY DIAG:  Chronic left shoulder pain  Other symptoms and signs involving the musculoskeletal system  Rationale for Evaluation and Treatment: Rehabilitation  SUBJECTIVE:   SUBJECTIVE STATEMENT: S: I'm definitely seeing some improvements.    PERTINENT HISTORY: Pt is a 57 y/o female presenting with chronic left shoulder pain for > 1 year, has a remote hx of LUE fractures at 57 year old. Pt does not recall any precipitating factors for this pain, has not had any injections or imaging, is pursuing conservative treatment first.   PRECAUTIONS: None  WEIGHT BEARING RESTRICTIONS: No  PAIN:  Are you having pain? Yes: NPRS scale: 2/10 Pain location: posterior left shoulder Pain description: aching Aggravating factors: increased movement Relieving factors: nothing  FALLS: Has patient fallen in last 6 months? No  PLOF: Independent  PATIENT GOALS: To have less pain in the left shoulder.   NEXT MD VISIT: 10/02/23  OBJECTIVE:   HAND DOMINANCE: Right  ADLs: Overall ADLs: Pt reports difficulty with reaching behind her, braiding her hair, reaching up into cabinets, pulling a garage door down. Pt reports difficulty sleeping, wakes up at least twice per night. Pt is unable to perform lifting tasks.   FUNCTIONAL OUTCOME MEASURES: FOTO: 59/100  UPPER EXTREMITY ROM:       Assessed in sitting, er/IR adducted  Active ROM Left eval  Shoulder flexion 154  Shoulder abduction 93  Shoulder internal rotation 90  Shoulder external rotation 75  (Blank rows = not tested)    UPPER EXTREMITY MMT:     Assessed in sitting, er/IR adducted  MMT  Left eval  Shoulder flexion 5/5  Shoulder abduction 4/5  Shoulder internal rotation 5/5  Shoulder external rotation 4+/5  (Blank rows = not tested)   OBSERVATIONS: mod fascial restrictions in the LUE; hypermobility of joints   TODAY'S TREATMENT:                                                                                                                              DATE:  04/22/23 -myofascial release to left upper arm, anterior shoulder, trapezius, and scapular regions to decrease pain and fascial restrictions -P/ROM: supine-flexion, abduction, er/IR, 5 reps -Shoulder strengthening:  1#, supine-protraction, flexion, abduction, er, horizontal abduction, 12 reps -Proximal shoulder strengthening: supine-paddles, criss cross, circles each direction, 10 reps -Shoulder strengthening: standing, 1#-protraction, flexion, abduction, er, horizontal abduction, 10 reps -ABC writing: 1# weight at 90 degrees flexion -Overhead lacing: seated, 1# wrist weight, lacing from top down then reversing -Ball on wall: 1' flexion, 1' abduction -Bodycraft: row #2 plate, press #1 plate, 10 reps -Wall push-ups: 10 reps -UBE: level 2, 2' forward 2' reverse, pace: 5.0  PATIENT EDUCATION: Education details: A/ROM-low weight Person educated: Patient Education method: Explanation, Demonstration, and Handouts Education comprehension: verbalized understanding and returned demonstration  HOME EXERCISE PROGRAM: Eval: red scapular theraband strengthening 6/26: A/ROM with low weight  GOALS: Goals reviewed with patient? Yes   SHORT TERM GOALS: Target date: 05/06/23  Pt will be provided with and educated on HEP to improve mobility in LUE required for use during ADL completion.   Goal status: IN PROGRESS    LONG TERM GOALS: Target date: 05/27/23  Pt will decrease pain in LUE to 3/10 or less to improve ability to sleep without waking more than 1 time nightly due to pain.   Goal status: IN PROGRESS  2.  Pt will decrease LUE fascial restrictions to min amounts or less to improve mobility required for functional reaching tasks.   Goal status: IN PROGRESS  3.  Pt will increase LUE A/ROM by 15 degrees to improve ability to use LUE when reaching overhead or behind back during dressing and bathing tasks.   Goal status: IN PROGRESS  4.  Pt will increase LUE strength to 5/5 or greater to improve ability to use LUE when lifting or carrying items during meal preparation/housework/yardwork tasks.   Goal status: IN PROGRESS  5.  Pt will return to highest level of function using LUE as non-dominant during  functional task completion.   Goal status: IN PROGRESS   ASSESSMENT:  CLINICAL IMPRESSION: Patient reports HEP is going well. She can tell a difference and she is not waking up with sharp pains now. Initiated myofascial release, pt with several trigger points in the shoulder girdle area. Also initiated strengthening and stability work, using low weight and targeting proximal shoulder strengthening also with ABC writing and ball on wall. Pt reports mild soreness in the arm at end of session. Verbal cuing for form and technique.  PERFORMANCE DEFICITS: in functional skills including in functional skills including ADLs, IADLs, coordination, tone, ROM, strength, pain, fascial restrictions, muscle spasms, and UE functional use     PLAN:  OT FREQUENCY: 1x/week  OT DURATION: 6 weeks  PLANNED INTERVENTIONS: self care/ADL training, therapeutic exercise, therapeutic activity, neuromuscular re-education, manual therapy, passive range of motion, splinting, electrical stimulation, ultrasound, moist heat, cryotherapy, patient/family education, and DME and/or AE instructions  CONSULTED AND AGREED WITH PLAN OF CARE: Patient  PLAN FOR NEXT SESSION: Follow up on HEP, continue stability work-theraband, push up work, therapy ball work   [employer], OTR/L  [phone] 04/22/2023, 2:27 PM

## 2023-04-27 ENCOUNTER — Encounter (HOSPITAL_COMMUNITY): Payer: 59 | Admitting: Occupational Therapy

## 2023-05-04 ENCOUNTER — Other Ambulatory Visit: Payer: 59

## 2023-05-13 ENCOUNTER — Ambulatory Visit (HOSPITAL_COMMUNITY): Payer: 59 | Attending: Internal Medicine | Admitting: Occupational Therapy

## 2023-05-13 ENCOUNTER — Encounter (HOSPITAL_COMMUNITY): Payer: Self-pay | Admitting: Occupational Therapy

## 2023-05-13 DIAGNOSIS — G8929 Other chronic pain: Secondary | ICD-10-CM | POA: Diagnosis present

## 2023-05-13 DIAGNOSIS — M25512 Pain in left shoulder: Secondary | ICD-10-CM | POA: Insufficient documentation

## 2023-05-13 DIAGNOSIS — R29898 Other symptoms and signs involving the musculoskeletal system: Secondary | ICD-10-CM | POA: Insufficient documentation

## 2023-05-13 NOTE — Patient Instructions (Signed)

## 2023-05-13 NOTE — Therapy (Signed)
OUTPATIENT OCCUPATIONAL THERAPY ORTHO TREATMENT  Patient Name: Jacqueline Torres MRN: [id number] DOB:October 28, 1965, 57 y.o., female Today's Date: 05/13/2023   END OF SESSION:  OT End of Session - 05/13/23 1514     Visit Number 3    Number of Visits 6    Date for OT Re-Evaluation 05/27/23    Authorization Type [employer] Generic    Authorization Time Period 50 visit limit, $35 copay    Authorization - Visit Number 3    Authorization - Number of Visits 50    OT Start Time 1430    OT Stop Time 1510    OT Time Calculation (min) 40 min    Activity Tolerance Patient tolerated treatment well    Behavior During Therapy [facility] for tasks assessed/performed               Past Medical History:  Diagnosis Date   Colon polyps    Menorrhagia    Past Surgical History:  Procedure Laterality Date   ABLATION  2005   uterine ablation   BLADDER SURGERY  2003   bladder tac   COLONOSCOPY WITH PROPOFOL N/A 03/12/2020   repeat after 5 yrs; Procedure: COLONOSCOPY WITH PROPOFOL;  Surgeon: Wyline Mood, MD;  Location: [facility] ENDOSCOPY;  Service: Gastroenterology;  Laterality: N/A;   TONSILLECTOMY     pt was 57 yrs old   Patient Active Problem List   Diagnosis Date Noted   Pain in left shoulder 04/02/2023   Pain in left hip 04/02/2023   Positive ANA (antinuclear antibody) 10/01/2022   Primary hypertension 06/11/2022   Psoriasis of scalp 06/11/2022   Vitamin D deficiency 05/23/2022   Vertigo 05/23/2022   Right thyroid nodule 05/23/2022   Dry mouth 05/23/2022   Dry eyes 05/23/2022   Polyarthralgia 05/23/2022    PCP: Mort Sawyers, FNP REFERRING PROVIDER: Dr. Sheliah Hatch  ONSET DATE: over a year  REFERRING DIAG: Chronic left shoulder pain  THERAPY DIAG:  Chronic left shoulder pain  Other symptoms and signs involving the musculoskeletal system  Rationale for Evaluation and Treatment: Rehabilitation  SUBJECTIVE:   SUBJECTIVE STATEMENT: S: It's feeling better.   PERTINENT HISTORY: Pt  is a 57 y/o female presenting with chronic left shoulder pain for > 1 year, has a remote hx of LUE fractures at 57 year old. Pt does not recall any precipitating factors for this pain, has not had any injections or imaging, is pursuing conservative treatment first.   PRECAUTIONS: None  WEIGHT BEARING RESTRICTIONS: No  PAIN:  Are you having pain? No  FALLS: Has patient fallen in last 6 months? No  PLOF: Independent  PATIENT GOALS: To have less pain in the left shoulder.   NEXT MD VISIT: 10/02/23  OBJECTIVE:   HAND DOMINANCE: Right  ADLs: Overall ADLs: Pt reports difficulty with reaching behind her, braiding her hair, reaching up into cabinets, pulling a garage door down. Pt reports difficulty sleeping, wakes up at least twice per night. Pt is unable to perform lifting tasks.   FUNCTIONAL OUTCOME MEASURES: FOTO: 59/100  UPPER EXTREMITY ROM:       Assessed in sitting, er/IR adducted  Active ROM Left eval  Shoulder flexion 154  Shoulder abduction 93  Shoulder internal rotation 90  Shoulder external rotation 75  (Blank rows = not tested)    UPPER EXTREMITY MMT:     Assessed in sitting, er/IR adducted  MMT Left eval  Shoulder flexion 5/5  Shoulder abduction 4/5  Shoulder internal rotation 5/5  Shoulder external rotation  4+/5  (Blank rows = not tested)   OBSERVATIONS: mod fascial restrictions in the LUE; hypermobility of joints   TODAY'S TREATMENT:                                                                                                                              DATE:  05/13/23 -myofascial release to left upper arm, anterior shoulder, trapezius, and scapular regions to decrease pain and fascial restrictions -P/ROM: supine-flexion, abduction, er/IR, 5 reps -Shoulder strengthening: 2#, supine-protraction, flexion, abduction, er, horizontal abduction, 10 reps -Proximal shoulder strengthening: supine, 2#-paddles, criss cross, circles each direction, 10 reps  each -Therapy ball strengthening: green ball-chest press, overhead press, flexion, circles each direction, 10 reps each -Ball on wall: 1' flexion, 1' abduction -Theraband strengthening: green-protraction, flexion, er, horizontal abduction, abduction, 10 reps  04/22/23 -myofascial release to left upper arm, anterior shoulder, trapezius, and scapular regions to decrease pain and fascial restrictions -P/ROM: supine-flexion, abduction, er/IR, 5 reps -Shoulder strengthening: 1#, supine-protraction, flexion, abduction, er, horizontal abduction, 12 reps -Proximal shoulder strengthening: supine-paddles, criss cross, circles each direction, 10 reps -Shoulder strengthening: standing, 1#-protraction, flexion, abduction, er, horizontal abduction, 10 reps -ABC writing: 1# weight at 90 degrees flexion -Overhead lacing: seated, 1# wrist weight, lacing from top down then reversing -Ball on wall: 1' flexion, 1' abduction -Bodycraft: row #2 plate, press #1 plate, 10 reps -Wall push-ups: 10 reps -UBE: level 2, 2' forward 2' reverse, pace: 5.0  PATIENT EDUCATION: Education details: green theraband strengthening Person educated: Patient Education method: Explanation, Demonstration, and Handouts Education comprehension: verbalized understanding and returned demonstration  HOME EXERCISE PROGRAM: Eval: green scapular theraband strengthening 6/26: A/ROM with low weight 7/17: green theraband strengthening  GOALS: Goals reviewed with patient? Yes   SHORT TERM GOALS: Target date: 05/06/23  Pt will be provided with and educated on HEP to improve mobility in LUE required for use during ADL completion.   Goal status: IN PROGRESS    LONG TERM GOALS: Target date: 05/27/23  Pt will decrease pain in LUE to 3/10 or less to improve ability to sleep without waking more than 1 time nightly due to pain.   Goal status: IN PROGRESS  2.  Pt will decrease LUE fascial restrictions to min amounts or less to improve  mobility required for functional reaching tasks.   Goal status: IN PROGRESS  3.  Pt will increase LUE A/ROM by 15 degrees to improve ability to use LUE when reaching overhead or behind back during dressing and bathing tasks.   Goal status: IN PROGRESS  4.  Pt will increase LUE strength to 5/5 or greater to improve ability to use LUE when lifting or carrying items during meal preparation/housework/yardwork tasks.   Goal status: IN PROGRESS  5.  Pt will return to highest level of function using LUE as non-dominant during functional task completion.   Goal status: IN PROGRESS   ASSESSMENT:  CLINICAL IMPRESSION: Patient reports HEP is going well, her arm  is feeling better. Upgraded strengthening to 2# weights, added therapy ball and theraband strengthening and stability work. Pt with ROM WNL, notes some fatigue and burning with strengthening tasks. Added green theraband shoulder strengthening and updated for HEP. Verbal cuing for form and technique.    PERFORMANCE DEFICITS: in functional skills including in functional skills including ADLs, IADLs, coordination, tone, ROM, strength, pain, fascial restrictions, muscle spasms, and UE functional use     PLAN:  OT FREQUENCY: 1x/week  OT DURATION: 6 weeks  PLANNED INTERVENTIONS: self care/ADL training, therapeutic exercise, therapeutic activity, neuromuscular re-education, manual therapy, passive range of motion, splinting, electrical stimulation, ultrasound, moist heat, cryotherapy, patient/family education, and DME and/or AE instructions  CONSULTED AND AGREED WITH PLAN OF CARE: Patient  PLAN FOR NEXT SESSION: Follow up on HEP, continue stability work-theraband, push up work, therapy ball work   [employer], OTR/L  [phone] 05/13/2023, 3:15 PM

## 2023-05-14 ENCOUNTER — Ambulatory Visit
Admission: RE | Admit: 2023-05-14 | Discharge: 2023-05-14 | Disposition: A | Discharge status: Home / Self Care | Payer: 59 | Source: Ambulatory Visit | Attending: Otolaryngology | Admitting: Otolaryngology

## 2023-05-14 DIAGNOSIS — E041 Nontoxic single thyroid nodule: Secondary | ICD-10-CM

## 2023-05-15 ENCOUNTER — Other Ambulatory Visit: Payer: 59

## 2023-05-20 ENCOUNTER — Encounter (HOSPITAL_COMMUNITY): Payer: 59 | Admitting: Occupational Therapy

## 2023-05-27 ENCOUNTER — Ambulatory Visit (HOSPITAL_COMMUNITY): Payer: 59 | Admitting: Occupational Therapy

## 2023-06-03 ENCOUNTER — Encounter (HOSPITAL_COMMUNITY): Payer: 59 | Admitting: Occupational Therapy

## 2023-06-04 ENCOUNTER — Other Ambulatory Visit: Payer: Self-pay | Admitting: Otolaryngology

## 2023-06-04 DIAGNOSIS — E041 Nontoxic single thyroid nodule: Secondary | ICD-10-CM

## 2023-06-11 NOTE — Progress Notes (Signed)
Spoke with patient 06/11/23 @ 1420. Reminded patient of appointment and need to arrive prior. Patient verbalized understanding.

## 2023-06-12 ENCOUNTER — Ambulatory Visit
Admission: RE | Admit: 2023-06-12 | Discharge: 2023-06-12 | Disposition: A | Discharge status: Home / Self Care | Payer: 59 | Source: Ambulatory Visit | Attending: Otolaryngology | Admitting: Otolaryngology

## 2023-06-12 DIAGNOSIS — E041 Nontoxic single thyroid nodule: Secondary | ICD-10-CM | POA: Diagnosis not present

## 2023-06-12 MED ORDER — LIDOCAINE HCL (PF) 1 % IJ SOLN
5.0000 mL | Freq: Once | INTRAMUSCULAR | Status: AC
  Administered 2023-06-12: 5 mL via INTRADERMAL

## 2023-07-07 ENCOUNTER — Other Ambulatory Visit: Payer: Self-pay | Admitting: Otolaryngology

## 2023-07-07 DIAGNOSIS — E041 Nontoxic single thyroid nodule: Secondary | ICD-10-CM

## 2023-07-30 ENCOUNTER — Encounter: Payer: Self-pay | Admitting: Internal Medicine

## 2023-07-30 ENCOUNTER — Ambulatory Visit (INDEPENDENT_AMBULATORY_CARE_PROVIDER_SITE_OTHER): Payer: Managed Care, Other (non HMO) | Admitting: Internal Medicine

## 2023-07-30 VITALS — BP 110/80 | HR 64 | Temp 98.3°F | Ht 68.0 in | Wt 194.0 lb

## 2023-07-30 DIAGNOSIS — R1013 Epigastric pain: Secondary | ICD-10-CM | POA: Diagnosis not present

## 2023-07-30 MED ORDER — OMEPRAZOLE 20 MG PO CPDR: 20.0000 mg | DELAYED_RELEASE_CAPSULE | Freq: Every day | ORAL | 0 refills | Status: AC

## 2023-07-30 NOTE — Assessment & Plan Note (Addendum)
With radiation to right chest and shoulder Some nausea Does seem more after eating and not exertional--but will check EKG EKG shows sinus at 62. Normal axis and intervals. No hypertrophy or ischemia. Normal--and no change from 03/28/22  Will try empiric omeprazole 20mg  daily Has limited caffeine and doesn't eat close to bedtime If ongoing problems, consider GI evaluation

## 2023-07-30 NOTE — Patient Instructions (Signed)
Start the omeprazole daily on an empty stomach. If your symptoms are quickly gone, take it for 2 weeks or so, then slowly wean off (like every other day for 2 weeks, then stopping)

## 2023-07-30 NOTE — Progress Notes (Addendum)
Subjective:    Patient ID: Jacqueline Torres, female    DOB: 09/22/1966, 57 y.o.   MRN: [id number]  HPI Here due to chest/abdominal pain  Having epigastric pain in the past couple of days No change in diet Some nausea after eating Actually tender there today Radiates up to right shoulder Pain is worse after eating--sharp Doesn't feel like heartburn No SOB Lasted many hours 2 nights ago Took pepto bismol---helped the nausea but not the pain  Does walk the dog regularly No symptoms with exertion  Also with flared psoriasis--under breasts and on back of head Clobetasol not working as well  Current Outpatient Medications on File Prior to Visit  Medication Sig Dispense Refill   clobetasol (TEMOVATE) 0.05 % external solution APPLY TO AFFECTED AREA TWICE A DAY 50 mL 0   No current facility-administered medications on file prior to visit.    Allergies  Allergen Reactions   Sulfa Antibiotics     Rash, pt feels like she is hungover.    Past Medical History:  Diagnosis Date   Colon polyps    Menorrhagia     Past Surgical History:  Procedure Laterality Date   ABLATION  2005   uterine ablation   BLADDER SURGERY  2003   bladder tac   COLONOSCOPY WITH PROPOFOL N/A 03/12/2020   repeat after 5 yrs; Procedure: COLONOSCOPY WITH PROPOFOL;  Surgeon: Wyline Mood, MD;  Location: [facility] ENDOSCOPY;  Service: Gastroenterology;  Laterality: N/A;   TONSILLECTOMY     pt was 57 yrs old    Family History  Problem Relation Age of Onset   Healthy Mother    Cancer Father        leomyosarcoma   Hypertension Father    Healthy Brother    Healthy Brother    Colon cancer Maternal Aunt 50   Crohn's disease Daughter    Lupus Sister    Healthy Son    Healthy Son    Healthy Son    Breast cancer Neg Hx    Ovarian cancer Neg Hx     Social History   Socioeconomic History   Marital status: Divorced    Spouse name: Not on file   Number of children: 4   Years of education: Not on file    Highest education level: Not on file  Occupational History    Employer: [employer]    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  Tobacco Use   Smoking status: Never    Passive exposure: Never   Smokeless tobacco: Never  Vaping Use   Vaping status: Never Used  Substance and Sexual Activity   Alcohol use: Never   Drug use: Never   Sexual activity: Yes    Partners: Male    Birth control/protection: Post-menopausal  Other Topics Concern   Not on file  Social History Narrative   Not on file   Social Determinants of Health   Financial Resource Strain: Not on file  Food Insecurity: Not on file  Transportation Needs: Not on file  Physical Activity: Not on file  Stress: Not on file  Social Connections: Not on file  Intimate Partner Violence: Not on file   Review of Systems No dysphagia No recent GERD problems Bowels are normal Appetite is off this week--but no weight loss No FH of CAD No arthritis or joint symptoms with the psoriasis Some fatigue    Objective:   Physical Exam Constitutional:      Appearance: Normal appearance.  Cardiovascular:  Rate and Rhythm: Normal rate and regular rhythm.     Heart sounds: No murmur heard.    No gallop.  Pulmonary:     Effort: Pulmonary effort is normal.     Breath sounds: Normal breath sounds. No wheezing or rales.     Comments: No sternal tenderness Abdominal:     Palpations: Abdomen is soft.     Tenderness: There is no abdominal tenderness.  Musculoskeletal:     Cervical back: Neck supple.     Right lower leg: No edema.     Left lower leg: No edema.  Lymphadenopathy:     Cervical: No cervical adenopathy.  Skin:    Comments: Small psoriatic plaque on occiput but no rash under breasts  Neurological:     Mental Status: She is alert.            Assessment & Plan:

## 2023-08-03 ENCOUNTER — Ambulatory Visit: Payer: 59 | Admitting: Family

## 2023-09-14 ENCOUNTER — Other Ambulatory Visit: Payer: Self-pay

## 2023-09-17 ENCOUNTER — Ambulatory Visit: Payer: 59 | Admitting: Gastroenterology

## 2023-09-17 ENCOUNTER — Ambulatory Visit: Payer: 59 | Admitting: Physician Assistant

## 2023-09-18 NOTE — Progress Notes (Unsigned)
Office Visit Note  Patient: Jacqueline Torres             Date of Birth: 26-Nov-1965           MRN: [id number]             PCP: Mort Sawyers, FNP Referring: Mort Sawyers, FNP Visit Date: 10/02/2023   Subjective:  No chief complaint on file.   History of Present Illness: Jacqueline Torres is a 57 y.o. female here for follow up with chronic joint pain in multiple areas questionable inflammatory disease with rashes, mouth sores, chronic eye dryness.    Previous HPI 04/02/2023 Jacqueline Torres is a 57 y.o. female here for follow up with chronic joint pain in multiple areas questionable inflammatory disease with rashes, mouth sores, chronic eye dryness.  Since her last visit rashes are overall doing pretty well.  Still has mild irritation on the scalp but none active elsewhere on the body.  She gets canker sores along the inside front of the mouth every few weeks but is clear of these completely more of the time than not.  For eye dryness uses refresh drops on some days but not on others and no significant vision change or acute inflammation.  Still having joint pains worse at the left shoulder and the hip currently shoulder is her biggest complaint.  She did not follow through with trying many of the recommended range of motion or strengthening exercises since last visit.   Previous HPI 10/01/22 Jacqueline Torres is a 57 y.o. female here for follow up for evaluation of multiple joint pains and fatigue and chronic dry eyes and mouth symptoms with positive ANA. Lab testing at initial visit was negative for evidence of autoimmune disease antibodies and systemic inflammation.  Since our initial visit symptoms remain mostly about the same regarding dryness fatigue and joint pains.   Previous HPI 08/26/22 Jacqueline Torres is a 56 y.o. female here for evaluation of positive ANA with multiple joint pains. Other findings also including thyroid nodule and dry eyes and mouth symptoms.  She has been noticing  symptoms for at least about 5 years duration present at some level continuously.  Originally had plantar fasciitis received an injection for this which was very helpful.  About 1-1/2 years after the initial injection and treatment she redeveloped similar pain from the plantar fasciitis and trial of repeat injections was no longer beneficial.  Since then she has had increased trouble involving the ankle and front of the foot as well as on the plantar area.  Also experiences mild hip pain but much less severe than in her foot and ankle.  The symptoms have limit her ability to walk for prolonged periods such as when walking her dogs and with certain exercises.  Joint pains have been more severe in the past 1 year involving her neck and shoulder as well.  She has felt more fatigued than usual.  Also having difficulty finding a comfortable arm position at nighttime which is frequently disturbing her from sleep. 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  This is usually not symptomatic or mildly itchy and just treated with topical clobetasol as needed. She has persistent dry eyes and mouth frequently with eye redness.  She has seen her ophthalmologist with a prescription of Restasis but did not tolerate the medication and is just treating with artificial tears.  She focuses on hydration for dry mouth does sometimes use gum or lozenges.  She does not typically see skin  rashes on her torso and extremities and no photosensitive rashes.  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 She broke her left arm as a child volar bleeding no other major joint injuries or joint surgeries.  She is postmenopausal by a few years.  She does not recall any other specific medical events proceeding symptoms worsening.   No Rheumatology ROS completed.   PMFS History:  Patient Active Problem List   Diagnosis Date Noted   Epigastric pain 07/30/2023    Pain in left shoulder 04/02/2023   Pain in left hip 04/02/2023   Positive ANA (antinuclear antibody) 10/01/2022   Primary hypertension 06/11/2022   Psoriasis of scalp 06/11/2022   Vitamin D deficiency 05/23/2022   Vertigo 05/23/2022   Right thyroid nodule 05/23/2022   Dry mouth 05/23/2022   Dry eyes 05/23/2022   Polyarthralgia 05/23/2022    Past Medical History:  Diagnosis Date   Colon polyps    Menorrhagia     Family History  Problem Relation Age of Onset   Healthy Mother    Cancer Father        leomyosarcoma   Hypertension Father    Healthy Brother    Healthy Brother    Colon cancer Maternal Aunt 71   Crohn's disease Daughter    Lupus Sister    Healthy Son    Healthy Son    Healthy Son    Breast cancer Neg Hx    Ovarian cancer Neg Hx    Past Surgical History:  Procedure Laterality Date   ABLATION  2005   uterine ablation   BLADDER SURGERY  2003   bladder tac   COLONOSCOPY WITH PROPOFOL N/A 03/12/2020   repeat after 5 yrs; Procedure: COLONOSCOPY WITH PROPOFOL;  Surgeon: Wyline Mood, MD;  Location: [facility] ENDOSCOPY;  Service: Gastroenterology;  Laterality: N/A;   TONSILLECTOMY     pt was 57 yrs old   Social History   Social History Narrative   Not on file   Immunization History  Administered Date(s) Administered   PFIZER(Purple Top)SARS-COV-2 Vaccination 01/13/2020, 02/08/2020     Objective: Vital Signs: There were no vitals taken for this visit.   Physical Exam   Musculoskeletal Exam: ***  CDAI Exam: CDAI Score: -- Patient Global: --; Provider Global: -- Swollen: --; Tender: -- Joint Exam 10/02/2023   No joint exam has been documented for this visit   There is currently no information documented on the homunculus. Go to the Rheumatology activity and complete the homunculus joint exam.  Investigation: No additional findings.  Imaging: No results found.  Recent Labs: Lab Results  Component Value Date   WBC 5.4 05/23/2022   HGB 14.4  05/23/2022   PLT 245 05/23/2022   NA 142 05/23/2022   K 4.0 05/23/2022   CL 104 05/23/2022   CO2 29 05/23/2022   GLUCOSE 112 (H) 05/23/2022   BUN 16 05/23/2022   CREATININE 0.89 05/23/2022   BILITOT 0.3 05/23/2022   ALKPHOS 62 06/22/2019   AST 20 05/23/2022   ALT 29 05/23/2022   PROT 6.8 05/23/2022   ALBUMIN 4.7 06/22/2019   CALCIUM 9.5 05/23/2022   GFRAA 81 06/22/2019    Speciality Comments: No specialty comments available.  Procedures:  No procedures performed Allergies: Ciprofloxacin and Sulfa antibiotics   Assessment / Plan:     Visit Diagnoses: No diagnosis found.  ***  Orders: No orders of the defined types were placed in  this encounter.  No orders of the defined types were placed in this encounter.    Follow-Up Instructions: No follow-ups on file.   Metta Clines, RT  Note - This record has been created using [employer].  Chart creation errors have been sought, but may not always  have been located. Such creation errors do not reflect on  the standard of medical care.

## 2023-10-02 ENCOUNTER — Ambulatory Visit: Payer: 59 | Admitting: Internal Medicine

## 2023-10-02 DIAGNOSIS — H04123 Dry eye syndrome of bilateral lacrimal glands: Secondary | ICD-10-CM

## 2023-10-02 DIAGNOSIS — M2550 Pain in unspecified joint: Secondary | ICD-10-CM

## 2023-10-02 DIAGNOSIS — M25552 Pain in left hip: Secondary | ICD-10-CM

## 2023-10-02 DIAGNOSIS — G8929 Other chronic pain: Secondary | ICD-10-CM

## 2023-10-02 DIAGNOSIS — R682 Dry mouth, unspecified: Secondary | ICD-10-CM

## 2023-10-02 DIAGNOSIS — L409 Psoriasis, unspecified: Secondary | ICD-10-CM

## 2023-12-07 IMAGING — MR MR HEAD W/O CM
12 series · 48 of 48 positions shown · non-contrast
Comparison: Same-day noncontrast head CT

CLINICAL DATA: Dizziness

EXAM:
MRI HEAD WITHOUT CONTRAST
TECHNIQUE: Multiplanar, multiecho pulse sequences of the brain and surrounding
structures were obtained without intravenous contrast.

[Series 5: ax dwi_tracew · axial · 3.0mm · 0.65mm/px · z∈[-76,+78]mm · 3 of 48 slices shown]
[im 1/48]
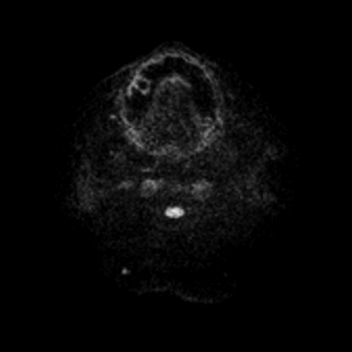
[im 24/48]
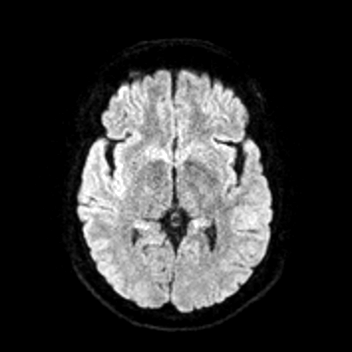
[im 48/48]
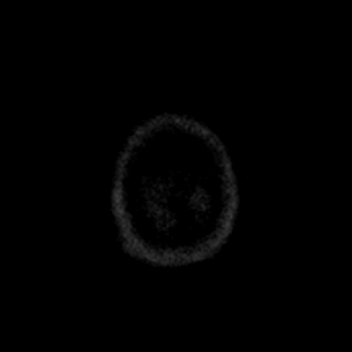

[Series 6: ax dwi_adc · axial · 3.0mm · 0.65mm/px · z∈[-76,+78]mm · 3 of 48 slices shown]
[im 1/48]
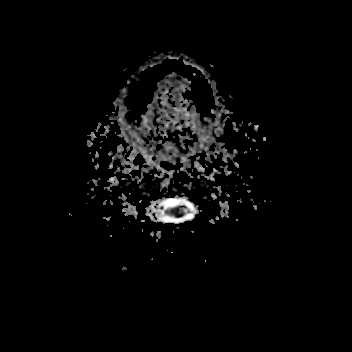
[im 24/48]
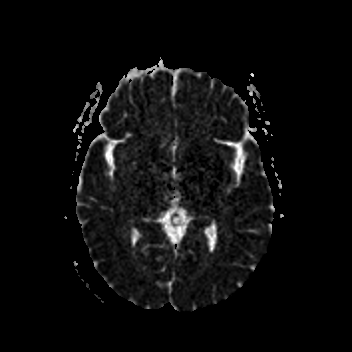
[im 48/48]
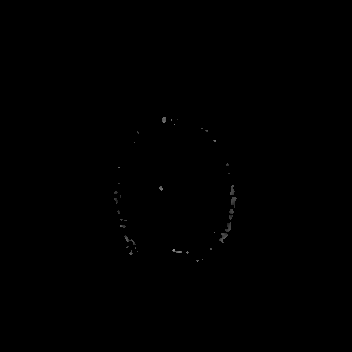

[Series 7: cor dwi_tracew · coronal · 5.0mm · 0.65mm/px · 2 of 36 slices shown]
[im 1/36]
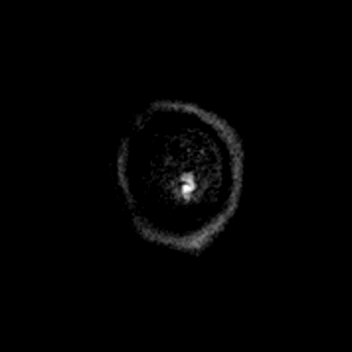
[im 36/36]
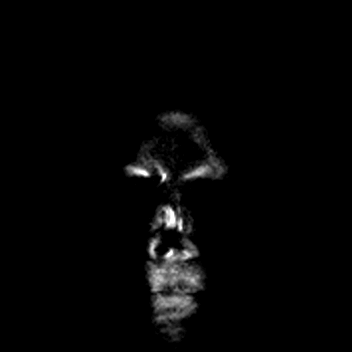

[Series 8: cor dwi_adc · coronal · 5.0mm · 0.65mm/px · 3 of 36 slices shown]
[im 1/36]
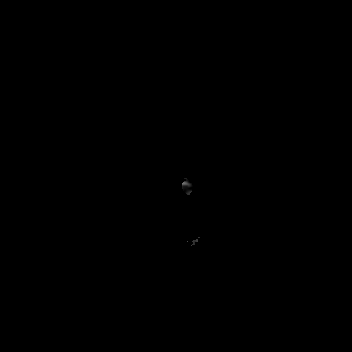
[im 18/36]
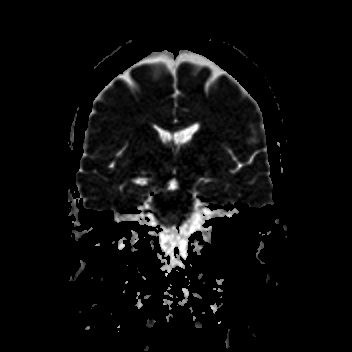
[im 36/36]
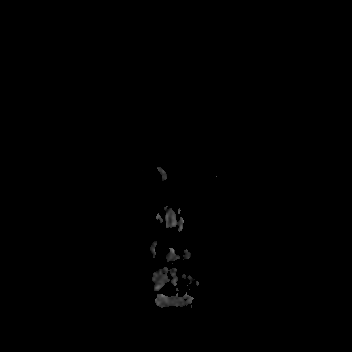

[Series 9: T1 · sagittal · 5.0mm · 0.62mm/px · 2 of 22 slices shown (1 of 2)]
[im 1/22]
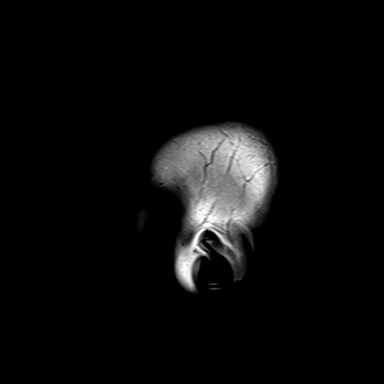
[im 22/22]
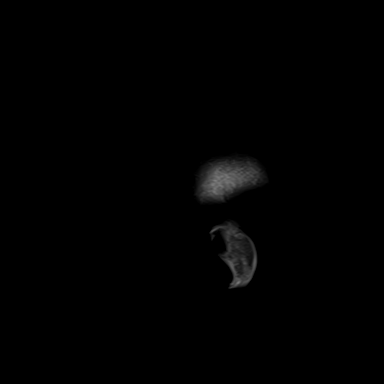

[Series 10: T2 · axial · 5.0mm · 0.53mm/px · z∈[-71,+72]mm · 2 of 25 slices shown (1 of 2)]
[im 1/25]
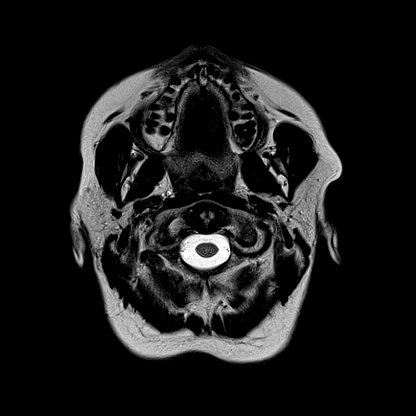
[im 25/25]
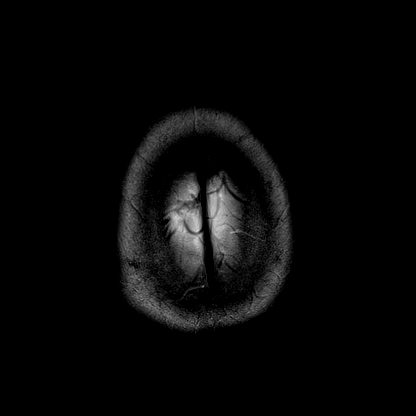

[Series 11: mag_images · axial · 3.0mm · 0.90mm/px · z∈[-86,+89]mm · 5 of 60 slices shown]
[im 1/60]
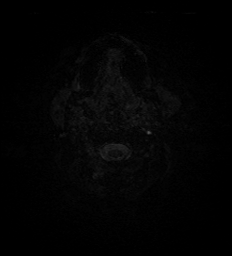
[im 15/60]
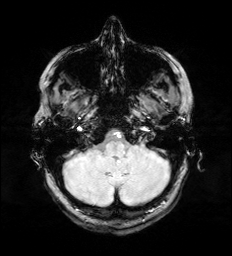
[im 30/60]
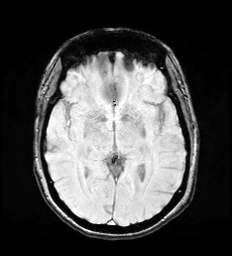
[im 45/60]
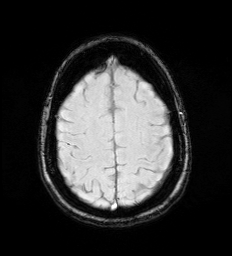
[im 60/60]
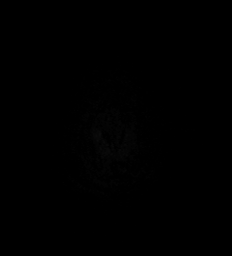

[Series 12: pha_images · axial · 3.0mm · 0.90mm/px · z∈[-86,+89]mm · 5 of 60 slices shown]
[im 1/60]
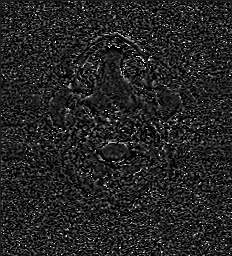
[im 15/60]
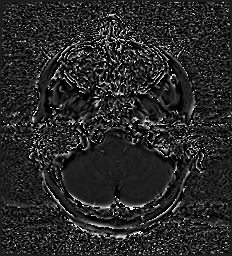
[im 30/60]
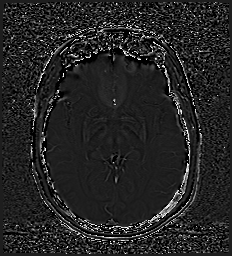
[im 45/60]
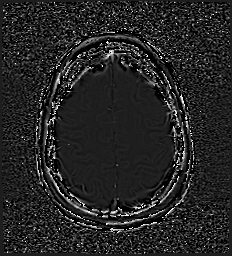
[im 60/60]
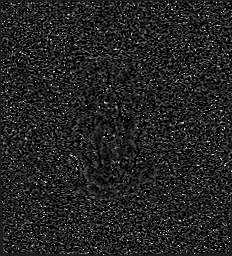

[Series 13: swi_images · axial · 3.0mm · 0.90mm/px · z∈[-86,+89]mm · 5 of 60 slices shown]
[im 1/60]
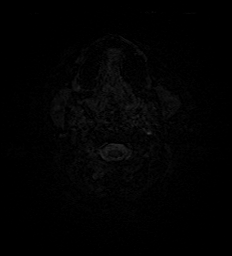
[im 15/60]
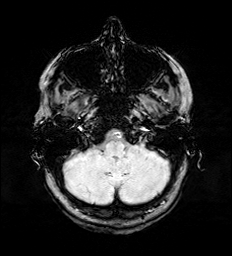
[im 30/60]
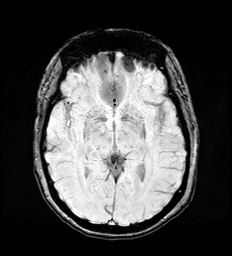
[im 45/60]
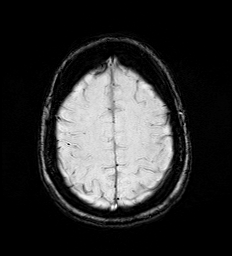
[im 60/60]
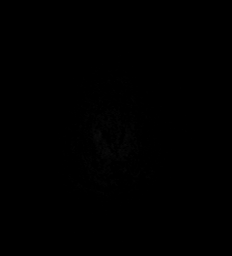

[Series 15: FLAIR · axial · 3.0mm · 0.53mm/px · z∈[-80,+81]mm · 4 of 55 slices shown]
[im 1/55]
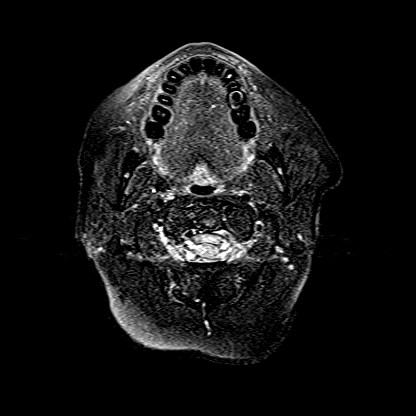
[im 19/55]
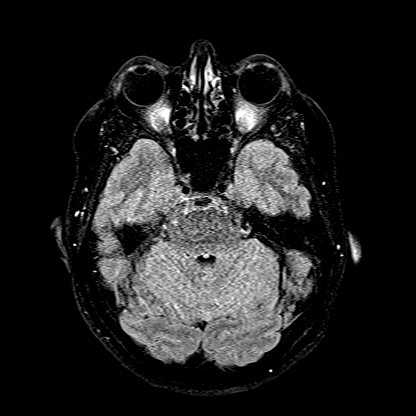
[im 37/55]
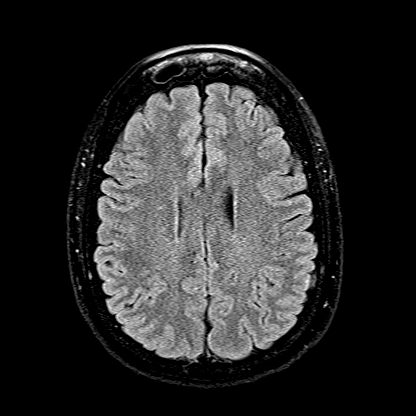
[im 55/55]
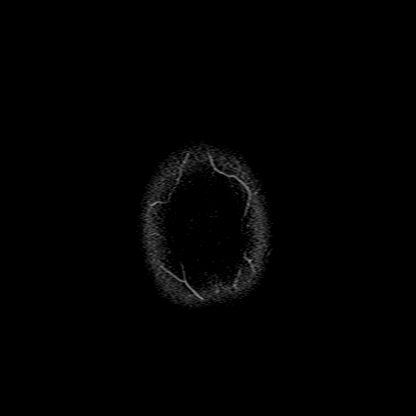

[Series 16: T1 · axial · 1.0mm · 0.98mm/px · z∈[-76,+81]mm · 12 of 160 slices shown (2 of 2)]
[im 1/160]
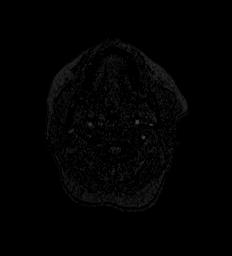
[im 15/160]
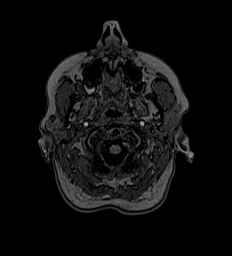
[im 29/160]
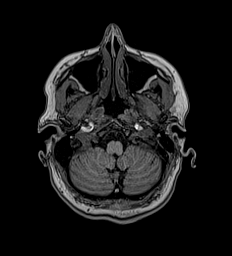
[im 44/160]
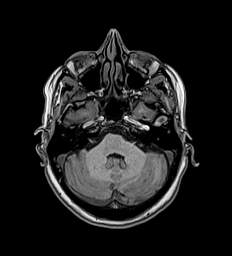
[im 58/160]
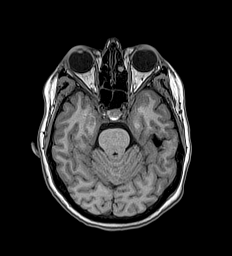
[im 73/160]
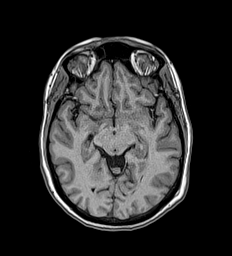
[im 87/160]
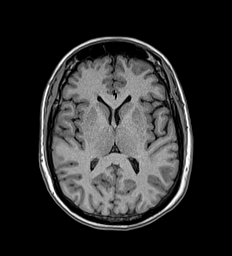
[im 102/160]
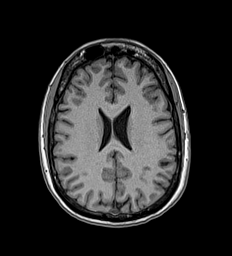
[im 116/160]
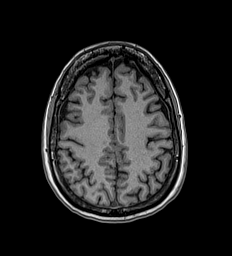
[im 131/160]
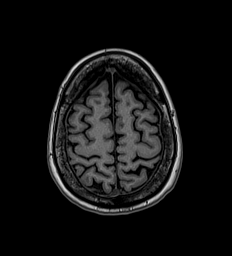
[im 145/160]
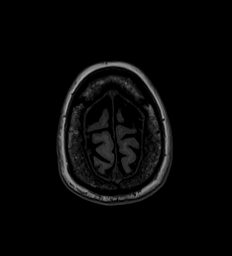
[im 160/160]
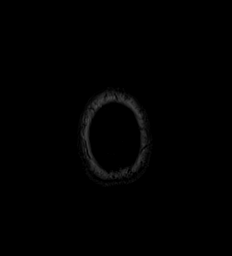

[Series 17: T2 · coronal · 5.0mm · 0.57mm/px · 2 of 29 slices shown (2 of 2)]
[im 1/29]
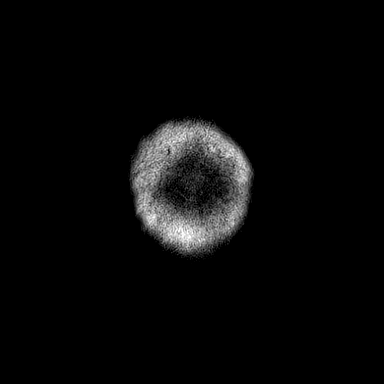
[im 29/29]
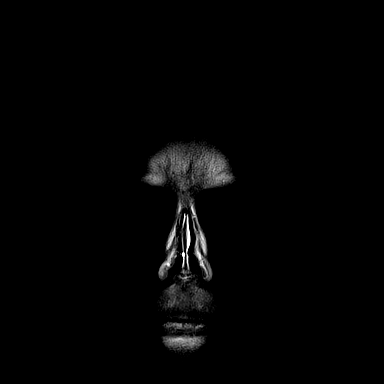

[48 of 48 positions shown; findings below may reference images not displayed]

FINDINGS: Brain: There is no acute intracranial hemorrhage, extra-axial fluid
collection, or acute infarct.

Parenchymal volume is normal. The ventricles are normal in size.
Gray-white differentiation is preserved. Parenchymal signal is
normal.

There is no mass lesion.  There is no mass effect or midline shift.

Vascular: Normal flow voids.

Skull and upper cervical spine: Normal marrow signal.

Sinuses/Orbits: There is mild mucosal thickening in the paranasal
sinuses. The globes and orbits are unremarkable.

Other: The mastoid air cells are clear.
IMPRESSION: Unremarkable brain MRI.

## 2024-01-05 ENCOUNTER — Ambulatory Visit
Admission: RE | Admit: 2024-01-05 | Discharge: 2024-01-05 | Disposition: A | Discharge status: Home / Self Care | Payer: Managed Care, Other (non HMO) | Source: Ambulatory Visit | Attending: Otolaryngology | Admitting: Otolaryngology

## 2024-01-05 DIAGNOSIS — E041 Nontoxic single thyroid nodule: Secondary | ICD-10-CM

## 2024-03-01 ENCOUNTER — Ambulatory Visit (INDEPENDENT_AMBULATORY_CARE_PROVIDER_SITE_OTHER): Admitting: Orthopedic Surgery

## 2024-03-01 ENCOUNTER — Encounter: Payer: Self-pay | Admitting: Orthopedic Surgery

## 2024-03-01 ENCOUNTER — Other Ambulatory Visit (INDEPENDENT_AMBULATORY_CARE_PROVIDER_SITE_OTHER): Payer: Self-pay

## 2024-03-01 VITALS — BP 127/86 | HR 64 | Ht 68.0 in | Wt 197.0 lb

## 2024-03-01 DIAGNOSIS — M25522 Pain in left elbow: Secondary | ICD-10-CM | POA: Diagnosis not present

## 2024-03-01 DIAGNOSIS — M7712 Lateral epicondylitis, left elbow: Secondary | ICD-10-CM

## 2024-03-01 NOTE — Progress Notes (Signed)
 New Patient Visit  Assessment: Jacqueline Torres is a 58 y.o. female with the following: 1. Lateral epicondylitis, left elbow  Plan: Jacqueline Torres has pain over the lateral elbow, consistent with lateral epicondylitis.  Pathology was discussed in detail.  Multiple treatment options were discussed.  At this point, she would like to try a forearm strap, as well as some home exercises.  These were provided for her.  In addition, she can consider topical treatments, including patches.  If she has any further issues, she can return to clinic.  Follow-up: Return if symptoms worsen or fail to improve.  Subjective:  Chief Complaint  Patient presents with   Elbow Pain    L after painting in Jan '25, pt states last week she lifted a cup of coffee and spilled the whole cup. Pain can radiate into the hand and feels like up into shoulder as well. 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     History of Present Illness: Jacqueline Torres is a 58 y.o. female who presents for evaluation of left elbow pain.  She is right-hand dominant.  She states that she has had pain in the lateral left elbow since January, after painting her house.  Since then, the pain is continued.  It is painful to grip objects.  She states that she spilled coffee recently, due to inability to hold a coffee cup.  She notes some radiating pains distally.  This is not happened to her before.  No prior injuries to the left elbow.  No numbness or tingling.   Review of Systems: No fevers or chills No numbness or tingling No chest pain No shortness of breath No bowel or bladder dysfunction No GI distress No headaches   Medical History:  Past Medical History:  Diagnosis Date   Colon polyps    Menorrhagia     Past Surgical History:  Procedure Laterality Date   ABLATION  2005   uterine ablation   BLADDER SURGERY  2003   bladder tac   COLONOSCOPY WITH PROPOFOL  N/A 03/12/2020   repeat after 5 yrs; Procedure: COLONOSCOPY WITH  PROPOFOL ;  Surgeon: Luke Salaam, MD;  Location: [facility] ENDOSCOPY;  Service: Gastroenterology;  Laterality: N/A;   TONSILLECTOMY     pt was 58 yrs old    Family History  Problem Relation Age of Onset   Healthy Mother    Cancer Father        leomyosarcoma   Hypertension Father    Healthy Brother    Healthy Brother    Colon cancer Maternal Aunt 50   Crohn's disease Daughter    Lupus Sister    Healthy Son    Healthy Son    Healthy Son    Breast cancer Neg Hx    Ovarian cancer Neg Hx    Social History   Tobacco Use   Smoking status: Never    Passive exposure: Never   Smokeless tobacco: Never  Vaping Use   Vaping status: Never Used  Substance Use Topics   Alcohol use: Never   Drug use: Never    Allergies  Allergen Reactions   Ciprofloxacin Other (See Comments)   Sulfa Antibiotics Rash    Rash, pt feels like she is hungover.    Current Meds  Medication Sig   clobetasol  (TEMOVATE ) 0.05 % external solution APPLY TO AFFECTED AREA TWICE A DAY   omeprazole  (PRILOSEC) 20 MG capsule Take 1 capsule (20 mg total) by mouth daily.    Objective:  BP 127/86   Pulse 64   Ht 5\' 8"  (1.727 m)   Wt 197 lb (89.4 kg)   BMI 29.95 kg/m   Physical Exam:  General: Alert and oriented. and No acute distress. Gait: Normal gait.  Evaluation of the left elbow demonstrates no deformity.  No redness.  No bruising.  No swelling.  Tenderness to palpation over the lateral epicondyle.  Pain with resisted long finger extension.  Pain with wrist extension, as well as resisted wrist extension.  Fingers are warm and well-perfused.  Sensation intact throughout the left hand.  IMAGING: I personally ordered and reviewed the following images   X-rays of the left elbow were obtained in clinic today.  No acute injuries noted.  Minimal degenerative changes.  Elbow is reduced.  No bony lesions.  Impression: Negative left elbow x-ray   New Medications:  No orders of the defined types were placed in  this encounter.     Jacqueline Frater, MD  03/01/2024 9:31 AM

## 2024-03-01 NOTE — Patient Instructions (Signed)
 Tennis Elbow Rehab Do exercises exactly as told by your health care provider and adjust them as directed. It is normal to feel mild stretching, pulling, tightness, or discomfort as you do these exercises. Stop right away if you feel sudden pain or your pain gets worse.   Stretching and range-of-motion exercises These exercises warm up your muscles and joints and improve the movement andflexibility of your elbow. Wrist flexion, assisted  Straighten your left / right elbow in front of you with your palm facing down toward the floor. If told by your health care provider, bend your left / right elbow to a 90-degree angle (right angle) at your side instead of holding it straight. With your other hand, gently push over the back of your left / right hand so your fingers point toward the floor (flexion). Stop when you feel a gentle stretch on the back of your forearm. Hold this position for 10 seconds. Repeat 10 times. Complete this exercise 1-2 times a day. Wrist extension, assisted  Straighten your left / right elbow in front of you with your palm facing up toward the ceiling. If told by your health care provider, bend your left / right elbow to a 90-degree angle (right angle) at your side instead of holding it straight. With your other hand, gently pull your left / right hand and fingers toward the floor (extension). Stop when you feel a gentle stretch on the palm side of your forearm. Hold this position for 10 seconds. Repeat 10 times. Complete this exercise 1-2 times a day. Assisted forearm rotation, supination Sit or stand with your elbows at your side. Bend your left / right elbow to a 90-degree angle (right angle). Using your uninjured hand, turn your left / right palm up toward the ceiling (supination) until you feel a gentle stretch along the inside of your forearm. Hold this position for 10 seconds. Repeat 10 times. Complete this exercise 1-2 times a day. Assisted forearm rotation,  pronation Sit or stand with your elbows at your side. Bend your left / right elbow to a 90-degree angle (right angle). Using your uninjured hand, turn your left / right palm down toward the floor (pronation) until you feel a gentle stretch along the outside of your forearm. Hold this position for 10 seconds. Repeat 10 times. Complete this exercise 1-2 times a day. Strengthening exercises These exercises build strength and endurance in your forearm and elbow. Endurance is the ability to use your muscles for a long time, even after theyget tired. Radial deviation  Stand with a 5 lbs weight or a hammer in your left / right hand. Or, sit while holding a rubber exercise band or tubing, with your left / right forearm supported on a table or countertop. Position your forearm so that the thumb is facing the ceiling, as if you are going to clap your hands. This is the neutral position. Raise your hand upward in front of you so your thumb moves toward the ceiling (radial deviation), or pull up on the rubber tubing. Keep your forearm and elbow still while you move your wrist only. Hold this position for 10 seconds. Slowly return to the starting position. Repeat 10 times. Complete this exercise 1-2 times a day. Wrist extension, eccentric Sit with your left / right forearm palm-down and supported on a table or other surface. Let your left / right wrist extend over the edge of the surface. Hold a 5 lbs (can of soup) weight or a piece of exercise  band or tubing in your left / right hand. If using a rubber exercise band or tubing, hold the other end of the tubing with your other hand. Use your uninjured hand to move your left / right hand up toward the ceiling. Take your uninjured hand away and slowly return to the starting position using only your left / right hand. Lowering your arm under tension is called eccentric extension. Repeat 10 times. Complete this exercise 1-2 times a day. Wrist extension  Do  not do this exercise if it causes pain at the outside of your elbow. Only do this exercise once instructed by your health care provider. Sit with your left / right forearm supported on a table or other surface and your palm turned down toward the floor. Let your left / right wrist extend over the edge of the surface. Hold a 5 lbs weight or a piece of rubber exercise band or tubing. If you are using a rubber exercise band or tubing, hold the band or tubing in place with your other hand to provide resistance. Slowly bend your wrist so your hand moves up toward the ceiling (extension). Move only your wrist, keeping your forearm and elbow still. Hold this position for 10 seconds. Slowly return to the starting position. Repeat 10 times. Complete this exercise 1-2 times a day. Forearm rotation, supination To do this exercise, you will need a lightweight hammer or rubber mallet. Sit with your left / right forearm supported on a table or other surface. Bend your elbow to a 90-degree angle (right angle). Position your forearm so that your palm is facing down toward the floor, with your hand resting over the edge of the table. Hold a hammer in your left / right hand. To make this exercise easier, hold the hammer near the head of the hammer. To make this exercise harder, hold the hammer near the end of the handle. Without moving your wrist or elbow, slowly rotate your forearm so your palm faces up toward the ceiling (supination). Hold this position for 10 seconds. Slowly return to the starting position. Repeat 10 times. Complete this exercise 1-2 times a day. Shoulder blade squeeze Sit in a stable chair or stand with good posture. If you are sitting down, do not let your back touch the back of the chair. Your arms should be at your sides with your elbows bent to a 90-degree angle (right angle). Position your forearms so that your thumbs are facing the ceiling (neutral position). Without lifting your  shoulders up, squeeze your shoulder blades tightly together. Hold this position for 10 seconds. Slowly release and return to the starting position. Repeat 10 times. Complete this exercise 1-2 times a day. This information is not intended to replace advice given to you by your health care provider. Make sure you discuss any questions you have with your healthcare provider. Document Revised: 01/04/2020 Document Reviewed: 01/04/2020 Elsevier Patient Education  2022 [employer].

## 2024-03-04 ENCOUNTER — Ambulatory Visit: Admitting: Orthopedic Surgery

## 2024-03-28 ENCOUNTER — Encounter (INDEPENDENT_AMBULATORY_CARE_PROVIDER_SITE_OTHER): Payer: Self-pay

## 2024-04-26 ENCOUNTER — Ambulatory Visit (INDEPENDENT_AMBULATORY_CARE_PROVIDER_SITE_OTHER): Admitting: Family

## 2024-04-26 ENCOUNTER — Encounter: Payer: Self-pay | Admitting: Family

## 2024-04-26 VITALS — BP 132/70 | HR 82 | Temp 98.0°F | Resp 16 | Ht 68.0 in | Wt 196.2 lb

## 2024-04-26 DIAGNOSIS — Z23 Encounter for immunization: Secondary | ICD-10-CM

## 2024-04-26 DIAGNOSIS — Z0001 Encounter for general adult medical examination with abnormal findings: Secondary | ICD-10-CM | POA: Insufficient documentation

## 2024-04-26 DIAGNOSIS — E559 Vitamin D deficiency, unspecified: Secondary | ICD-10-CM | POA: Diagnosis not present

## 2024-04-26 DIAGNOSIS — F418 Other specified anxiety disorders: Secondary | ICD-10-CM | POA: Insufficient documentation

## 2024-04-26 DIAGNOSIS — L409 Psoriasis, unspecified: Secondary | ICD-10-CM | POA: Diagnosis not present

## 2024-04-26 DIAGNOSIS — I10 Essential (primary) hypertension: Secondary | ICD-10-CM

## 2024-04-26 DIAGNOSIS — Z124 Encounter for screening for malignant neoplasm of cervix: Secondary | ICD-10-CM

## 2024-04-26 DIAGNOSIS — Z1231 Encounter for screening mammogram for malignant neoplasm of breast: Secondary | ICD-10-CM

## 2024-04-26 LAB — CBC
HCT: 44.8 % (ref 36.0–46.0)
Hemoglobin: 15.1 g/dL — ABNORMAL HIGH (ref 12.0–15.0)
MCHC: 33.8 g/dL (ref 30.0–36.0)
MCV: 86 fl (ref 78.0–100.0)
Platelets: 226 10*3/uL (ref 150.0–400.0)
RBC: 5.2 Mil/uL — ABNORMAL HIGH (ref 3.87–5.11)
RDW: 13.7 % (ref 11.5–15.5)
WBC: 4.6 10*3/uL (ref 4.0–10.5)

## 2024-04-26 LAB — BASIC METABOLIC PANEL WITH GFR
BUN: 14 mg/dL (ref 6–23)
CO2: 32 meq/L (ref 19–32)
Calcium: 9.6 mg/dL (ref 8.4–10.5)
Chloride: 105 meq/L (ref 96–112)
Creatinine, Ser: 0.94 mg/dL (ref 0.40–1.20)
GFR: 66.71 mL/min (ref >60.00)
Glucose, Bld: 105 mg/dL — ABNORMAL HIGH (ref 70–99)
Potassium: 4.2 meq/L (ref 3.5–5.1)
Sodium: 143 meq/L (ref 135–145)

## 2024-04-26 LAB — LIPID PANEL
Cholesterol: 178 mg/dL (ref 0–200)
HDL: 64 mg/dL (ref >39.00)
LDL Cholesterol: 97 mg/dL (ref 0–99)
NonHDL: 113.84
Total CHOL/HDL Ratio: 3
Triglycerides: 86 mg/dL (ref 0.0–149.0)
VLDL: 17.2 mg/dL (ref 0.0–40.0)

## 2024-04-26 LAB — MICROALBUMIN / CREATININE URINE RATIO
Creatinine,U: 206.9 mg/dL
Microalb Creat Ratio: 7.4 mg/g (ref 0.0–30.0)
Microalb, Ur: 1.5 mg/dL (ref 0.0–1.9)

## 2024-04-26 LAB — TSH: TSH: 1.09 u[IU]/mL (ref 0.35–5.50)

## 2024-04-26 LAB — VITAMIN D 25 HYDROXY (VIT D DEFICIENCY, FRACTURES): VITD: 20.2 ng/mL — ABNORMAL LOW (ref 30.00–100.00)

## 2024-04-26 MED ORDER — CLOBETASOL PROPIONATE 0.05 % EX SOLN: Freq: Two times a day (BID) | CUTANEOUS | 1 refills | Status: AC

## 2024-04-26 MED ORDER — LOSARTAN POTASSIUM 50 MG PO TABS: 50.0000 mg | ORAL_TABLET | Freq: Every day | ORAL | 0 refills | Status: DC

## 2024-04-26 NOTE — Assessment & Plan Note (Signed)
 Ordered vitamin d pending results.

## 2024-04-26 NOTE — Patient Instructions (Addendum)
  Start losartan  50 mg once daily   Start monitoring your blood pressure daily, around the same time of day, for the next 2-3 weeks.  Ensure that you have rested for 30 minutes prior to checking your blood pressure. Record your readings and bring them to your next visit.  ------------------------------------   ------------------------------------ I have sent an electronic order over to your preferred location for the following:   []   2D Mammogram  []   3D Mammogram  []   Bone Density   Please give this center a call to get scheduled at your convenience.  [x]   [facility]  [address] [place] [address]  [phone]  Make sure to wear two piece  clothing  No lotions powders or deodorants the day of the appointment Make sure to bring picture ID and insurance card.  Bring list of medications you are currently taking including any supplements.    ------------------------------------ A referral was placed today for both therapy and gynecology  Please let us  know if you have not heard back within 2 weeks about the referral.

## 2024-04-26 NOTE — Assessment & Plan Note (Signed)
 Start losartan  50 mg once daily Ordering urine m/a  Pt advised of the following:  Continue medication as prescribed. Monitor blood pressure periodically and/or when you feel symptomatic. Goal is <130/90 on average. Ensure that you have rested for 30 minutes prior to checking your blood pressure. Record your readings and bring them to your next visit if necessary.work on a low sodium diet.

## 2024-04-26 NOTE — Assessment & Plan Note (Signed)

## 2024-04-26 NOTE — Addendum Note (Signed)
 Addended by: LEARTA PORTO D on: 04/26/2024 09:08 AM   Modules accepted: Orders

## 2024-04-26 NOTE — Assessment & Plan Note (Signed)
 Discussed options Pt agreeable to therapy  Sending referral Will treat blood pressure then reassess next visit if we need to consider antianxiety med

## 2024-04-26 NOTE — Assessment & Plan Note (Signed)
 Refill clobetasol   Bid x 2 weeks then maintenance two days a week

## 2024-04-26 NOTE — Progress Notes (Signed)
 Subjective:  Patient ID: Jacqueline Torres, female    DOB: 09-Apr-1966  Age: 58 y.o. MRN: [id number]  Patient Care Team: Corwin Antu, FNP as PCP - General (Family Medicine)   CC:  Chief Complaint  Patient presents with   Annual Exam    HPI Jacqueline Torres is a 58 y.o. female who presents today for an annual physical exam. She reports consuming a general diet. Walks a few times a week She generally feels fairly well. She reports sleeping fairly well. She does have additional problems to discuss today.   Vision:Within last year Dental:Receives regular dental care  Mammogram: 2023 benign cyst  Last pap: 2020 negative she does have a gynecologist, she goes to this.  Colonoscopy: 2021 every five years   Pt is with acute concerns.   Has been getting her blood pressure checked at church and she was advised by the community nurse to come in and discuss it with her doctor. 150/80 and the second time was 140/84.  At work yesterday her blood pressure was 157/93. Sitting down at least 5 minutes feet flat prior to checking. Se also reports she just doesn't feel 'good' she will have headache and neck ache, and she has been under a lot of stress. She also has noticed that her nails are starting to peel and split, she used to have 'nice fingernails'. She does have h/o psoriasis with some recent flare ups.   She was seen back in 2023 for positive ANA. She was evaluated and advised that there was nothing positive at that time. Same joint pains and stiffness come and goes, elbows will bother her at times.   Thyroid  biopsy, indeterminate following with endo annually. Most recent thyroid  u/s 12/2023.   Advanced Directives Patient does not have advanced directives    DEPRESSION SCREENING    04/26/2024    7:58 AM 05/23/2022    3:02 PM  PHQ 2/9 Scores  PHQ - 2 Score 0 2  PHQ- 9 Score  6     ROS: Negative unless specifically indicated above in HPI.    Current Outpatient Medications:    losartan   (COZAAR ) 50 MG tablet, Take 1 tablet (50 mg total) by mouth daily., Disp: 90 tablet, Rfl: 0   omeprazole  (PRILOSEC) 20 MG capsule, Take 1 capsule (20 mg total) by mouth daily., Disp: 90 capsule, Rfl: 0   clobetasol  (TEMOVATE ) 0.05 % external solution, Apply topically 2 (two) times daily., Disp: 50 mL, Rfl: 1    Objective:    BP 132/70   Pulse 82   Temp 98 F (36.7 C)   Resp 16   Ht 5' 8 (1.727 m)   Wt 196 lb 3.2 oz (89 kg)   SpO2 98%   BMI 29.83 kg/m   BP Readings from Last 3 Encounters:  04/26/24 132/70  03/01/24 127/86  07/30/23 110/80      Physical Exam Constitutional:      General: She is not in acute distress.    Appearance: Normal appearance. She is normal weight. She is not ill-appearing.  HENT:     Head: Normocephalic.     Right Ear: Tympanic membrane normal.     Left Ear: Tympanic membrane normal.     Nose: Nose normal.     Mouth/Throat:     Mouth: Mucous membranes are moist.   Eyes:     Extraocular Movements: Extraocular movements intact.     Pupils: Pupils are equal, round, and reactive to light.    Cardiovascular:  Rate and Rhythm: Normal rate and regular rhythm.  Pulmonary:     Effort: Pulmonary effort is normal.     Breath sounds: Normal breath sounds.  Abdominal:     General: Abdomen is flat. Bowel sounds are normal.     Palpations: Abdomen is soft.     Tenderness: There is no guarding or rebound.   Musculoskeletal:        General: Normal range of motion.     Cervical back: Normal range of motion.   Skin:    General: Skin is warm.     Capillary Refill: Capillary refill takes less than 2 seconds.     Comments: Some pitting multiple fingernails with thinning    Neurological:     General: No focal deficit present.     Mental Status: She is alert.   Psychiatric:        Mood and Affect: Mood normal.        Behavior: Behavior normal.        Thought Content: Thought content normal.        Judgment: Judgment normal.            Assessment & Plan:  Primary hypertension Assessment & Plan: Start losartan  50 mg once daily Ordering urine m/a  Pt advised of the following:  Continue medication as prescribed. Monitor blood pressure periodically and/or when you feel symptomatic. Goal is <130/90 on average. Ensure that you have rested for 30 minutes prior to checking your blood pressure. Record your readings and bring them to your next visit if necessary.work on a low sodium diet.   Orders: -     Losartan  Potassium; Take 1 tablet (50 mg total) by mouth daily.  Dispense: 90 tablet; Refill: 0 -     Microalbumin / creatinine urine ratio  Psoriasis of scalp Assessment & Plan: Refill clobetasol   Bid x 2 weeks then maintenance two days a week   Orders: -     Ambulatory referral to Dermatology -     Clobetasol  Propionate; Apply topically 2 (two) times daily.  Dispense: 50 mL; Refill: 1  Vitamin D  deficiency Assessment & Plan: Ordered vitamin d  pending results.    Orders: -     VITAMIN D  25 Hydroxy (Vit-D Deficiency, Fractures)  Encounter for general adult medical examination with abnormal findings Assessment & Plan: Patient Counseling(The following topics were reviewed):  Preventative care handout given to pt  Health maintenance and immunizations reviewed. Please refer to Health maintenance section. Pt advised on safe sex, wearing seatbelts in car, and proper nutrition labwork ordered today for annual Dental health: Discussed importance of regular tooth brushing, flossing, and dental visits.   Orders: -     TSH -     Lipid panel -     Basic metabolic panel with GFR -     CBC  Screening mammogram for breast cancer -     3D Screening Mammogram, Left and Right; Future  Anxiety associated with depression Assessment & Plan: Discussed options Pt agreeable to therapy  Sending referral Will treat blood pressure then reassess next visit if we need to consider antianxiety med  Orders: -     Ambulatory  referral to Psychology  Cervical cancer screening -     Ambulatory referral to Obstetrics / Gynecology      Follow-up: Return in about 2 weeks (around 05/10/2024) for f/u blood pressure.   Ginger Patrick, FNP

## 2024-04-27 ENCOUNTER — Ambulatory Visit: Payer: Self-pay | Admitting: Family

## 2024-04-27 DIAGNOSIS — E559 Vitamin D deficiency, unspecified: Secondary | ICD-10-CM

## 2024-04-27 MED ORDER — CHOLECALCIFEROL 1.25 MG (50000 UT) PO TABS: 1.0000 | ORAL_TABLET | ORAL | 0 refills | Status: AC

## 2024-05-20 ENCOUNTER — Encounter: Payer: Self-pay | Admitting: Family

## 2024-05-20 ENCOUNTER — Ambulatory Visit: Payer: Self-pay | Admitting: Family

## 2024-05-20 ENCOUNTER — Ambulatory Visit: Admitting: Family

## 2024-05-20 VITALS — BP 112/78 | HR 61 | Temp 98.7°F | Ht 68.0 in | Wt 196.8 lb

## 2024-05-20 DIAGNOSIS — Z23 Encounter for immunization: Secondary | ICD-10-CM

## 2024-05-20 DIAGNOSIS — R739 Hyperglycemia, unspecified: Secondary | ICD-10-CM | POA: Insufficient documentation

## 2024-05-20 DIAGNOSIS — I10 Essential (primary) hypertension: Secondary | ICD-10-CM | POA: Diagnosis not present

## 2024-05-20 LAB — POCT GLYCOSYLATED HEMOGLOBIN (HGB A1C): Hemoglobin A1C: 5.7 % — AB (ref 4.0–5.6)

## 2024-05-20 MED ORDER — LOSARTAN POTASSIUM 25 MG PO TABS
25.0000 mg | ORAL_TABLET | Freq: Every day | ORAL | Status: AC
Start: 2024-05-20 — End: ?

## 2024-05-20 NOTE — Assessment & Plan Note (Signed)
A1c today in office

## 2024-05-20 NOTE — Assessment & Plan Note (Signed)
 Pt advised of the following:  Continue medication as prescribed. Monitor blood pressure periodically and/or when you feel symptomatic. Goal is <130/90 on average. Ensure that you have rested for 30 minutes prior to checking your blood pressure. Record your readings and bring them to your next visit if necessary.work on a low sodium diet. Continue losartan  25 mg once daily

## 2024-05-20 NOTE — Progress Notes (Signed)
 Established Patient Office Visit  Subjective:   Patient ID: Jacqueline Torres, female    DOB: 1966/01/18  Age: 58 y.o. MRN: [id number]  CC:  Chief Complaint  Patient presents with   Medical Management of Chronic Issues    HPI: Jacqueline Torres is a 58 y.o. female presenting on 05/20/2024 for Medical Management of Chronic Issues  Anxiety depression: referred last visit to psychology.   HTN: started on losartan  50 mg once daily last visit 7/1 however she said the 50 mg was too high and so she cut it in half and has been taking 25 mg once daily. She states average at home has been around <130/90, lowest 112/70, highest 132/88      ROS: Negative unless specifically indicated above in HPI.   Relevant past medical history reviewed and updated as indicated.   Allergies and medications reviewed and updated.   Current Outpatient Medications:    Cholecalciferol  1.25 MG (50000 UT) TABS, Take 1 tablet by mouth once a week., Disp: 8 tablet, Rfl: 0   clobetasol  (TEMOVATE ) 0.05 % external solution, Apply topically 2 (two) times daily., Disp: 50 mL, Rfl: 1   losartan  (COZAAR ) 25 MG tablet, Take 1 tablet (25 mg total) by mouth daily., Disp: , Rfl:    omeprazole  (PRILOSEC) 20 MG capsule, Take 1 capsule (20 mg total) by mouth daily., Disp: 90 capsule, Rfl: 0  Allergies  Allergen Reactions   Ciprofloxacin Other (See Comments)   Sulfa Antibiotics Rash    Rash, pt feels like she is hungover.    Objective:   BP 112/78 (BP Location: Left Arm, Patient Position: Sitting, Cuff Size: Large)   Pulse 61   Temp 98.7 F (37.1 C) (Temporal)   Ht 5' 8 (1.727 m)   Wt 196 lb 12.8 oz (89.3 kg)   SpO2 96%   BMI 29.92 kg/m    Physical Exam Vitals reviewed.  Constitutional:      General: She is not in acute distress.    Appearance: Normal appearance. She is normal weight. She is not ill-appearing, toxic-appearing or diaphoretic.  HENT:     Head: Normocephalic.  Cardiovascular:     Rate and Rhythm:  Normal rate and regular rhythm.  Pulmonary:     Effort: Pulmonary effort is normal.  Musculoskeletal:        General: Normal range of motion.  Neurological:     General: No focal deficit present.     Mental Status: She is alert and oriented to person, place, and time. Mental status is at baseline.  Psychiatric:        Mood and Affect: Mood normal.        Behavior: Behavior normal.        Thought Content: Thought content normal.        Judgment: Judgment normal.     Assessment & Plan:  Hyperglycemia Assessment & Plan: A1c today in office   Orders: -     POCT glycosylated hemoglobin (Hb A1C)  Primary hypertension Assessment & Plan: Pt advised of the following:  Continue medication as prescribed. Monitor blood pressure periodically and/or when you feel symptomatic. Goal is <130/90 on average. Ensure that you have rested for 30 minutes prior to checking your blood pressure. Record your readings and bring them to your next visit if necessary.work on a low sodium diet. Continue losartan  25 mg once daily   Orders: -     Losartan  Potassium; Take 1 tablet (25 mg total) by mouth daily.  Need  for shingles vaccine -     Varicella-zoster vaccine IM     Follow up plan: Return in about 6 months (around 11/20/2024) for f/u blood pressure.  Ginger Patrick, FNP

## 2024-06-15 ENCOUNTER — Ambulatory Visit
Admission: RE | Admit: 2024-06-15 | Discharge: 2024-06-15 | Disposition: A | Discharge status: Home / Self Care | Source: Ambulatory Visit | Attending: Family | Admitting: Family

## 2024-06-15 DIAGNOSIS — Z1231 Encounter for screening mammogram for malignant neoplasm of breast: Secondary | ICD-10-CM | POA: Diagnosis present

## 2024-06-19 ENCOUNTER — Other Ambulatory Visit: Payer: Self-pay | Admitting: Family

## 2024-06-19 DIAGNOSIS — E559 Vitamin D deficiency, unspecified: Secondary | ICD-10-CM

## 2024-07-05 ENCOUNTER — Encounter: Admitting: Family

## 2024-07-19 ENCOUNTER — Other Ambulatory Visit: Payer: Self-pay | Admitting: Family

## 2024-07-19 DIAGNOSIS — I10 Essential (primary) hypertension: Secondary | ICD-10-CM

## 2024-12-13 ENCOUNTER — Ambulatory Visit: Admitting: Dermatology
